# Patient Record
Sex: Male | Born: 1980 | Race: White | Hispanic: No | Marital: Married | Smoking: Current every day smoker
Health system: Southern US, Community
[De-identification: ages and names within clinical notes are randomized; demographics above are authoritative.]

## PROBLEM LIST (undated history)

## (undated) DIAGNOSIS — J45909 Unspecified asthma, uncomplicated: Secondary | ICD-10-CM

## (undated) DIAGNOSIS — R030 Elevated blood-pressure reading, without diagnosis of hypertension: Secondary | ICD-10-CM

## (undated) DIAGNOSIS — I10 Essential (primary) hypertension: Secondary | ICD-10-CM

## (undated) DIAGNOSIS — K429 Umbilical hernia without obstruction or gangrene: Secondary | ICD-10-CM

## (undated) DIAGNOSIS — E669 Obesity, unspecified: Secondary | ICD-10-CM

## (undated) DIAGNOSIS — C021 Malignant neoplasm of border of tongue: Secondary | ICD-10-CM

## (undated) DIAGNOSIS — F17200 Nicotine dependence, unspecified, uncomplicated: Secondary | ICD-10-CM

## (undated) HISTORY — DX: Malignant neoplasm of border of tongue: C02.1

## (undated) HISTORY — PX: TYMPANOSTOMY TUBE PLACEMENT: SHX32

---
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
## 2006-07-31 ENCOUNTER — Ambulatory Visit: Payer: Self-pay | Admitting: Emergency Medicine

## 2014-07-09 ENCOUNTER — Other Ambulatory Visit: Payer: Self-pay

## 2014-07-09 ENCOUNTER — Ambulatory Visit
Admit: 2014-07-09 | Disposition: A | Discharge status: Home / Self Care | Payer: Self-pay | Attending: Otolaryngology | Admitting: Otolaryngology

## 2014-07-09 HISTORY — PX: EXCISION OF TONGUE LESION: SHX6434

## 2014-07-21 ENCOUNTER — Inpatient Hospital Stay: Payer: PRIVATE HEALTH INSURANCE | Attending: Oncology | Admitting: Oncology

## 2014-07-21 ENCOUNTER — Encounter (INDEPENDENT_AMBULATORY_CARE_PROVIDER_SITE_OTHER): Payer: Self-pay

## 2014-07-21 ENCOUNTER — Encounter: Payer: Self-pay | Admitting: Oncology

## 2014-07-21 ENCOUNTER — Ambulatory Visit: Payer: PRIVATE HEALTH INSURANCE

## 2014-07-21 ENCOUNTER — Ambulatory Visit: Payer: PRIVATE HEALTH INSURANCE | Admitting: Oncology

## 2014-07-21 VITALS — BP 134/91 | HR 68 | Temp 94.9°F | Ht 74.0 in | Wt 260.1 lb

## 2014-07-21 DIAGNOSIS — G893 Neoplasm related pain (acute) (chronic): Secondary | ICD-10-CM | POA: Diagnosis not present

## 2014-07-21 DIAGNOSIS — C760 Malignant neoplasm of head, face and neck: Secondary | ICD-10-CM

## 2014-07-21 DIAGNOSIS — F17210 Nicotine dependence, cigarettes, uncomplicated: Secondary | ICD-10-CM | POA: Diagnosis not present

## 2014-07-21 DIAGNOSIS — C029 Malignant neoplasm of tongue, unspecified: Secondary | ICD-10-CM | POA: Insufficient documentation

## 2014-07-21 DIAGNOSIS — C023 Malignant neoplasm of anterior two-thirds of tongue, part unspecified: Secondary | ICD-10-CM | POA: Insufficient documentation

## 2014-07-21 DIAGNOSIS — F17220 Nicotine dependence, chewing tobacco, uncomplicated: Secondary | ICD-10-CM | POA: Diagnosis not present

## 2014-07-21 LAB — COMPREHENSIVE METABOLIC PANEL
ALT: 20 U/L (ref 17–63)
AST: 17 U/L (ref 15–41)
Albumin: 4.2 g/dL (ref 3.5–5.0)
Alkaline Phosphatase: 47 U/L (ref 38–126)
Anion gap: 5 (ref 5–15)
BILIRUBIN TOTAL: 0.6 mg/dL (ref 0.3–1.2)
BUN: 17 mg/dL (ref 6–20)
CHLORIDE: 107 mmol/L (ref 101–111)
CO2: 26 mmol/L (ref 22–32)
Calcium: 8.8 mg/dL — ABNORMAL LOW (ref 8.9–10.3)
Creatinine, Ser: 0.85 mg/dL (ref 0.61–1.24)
GFR calc Af Amer: >60 mL/min (ref >60)
Glucose, Bld: 103 mg/dL — ABNORMAL HIGH (ref 65–99)
Potassium: 3.7 mmol/L (ref 3.5–5.1)
SODIUM: 138 mmol/L (ref 135–145)
Total Protein: 7.4 g/dL (ref 6.5–8.1)

## 2014-07-21 LAB — CBC WITH DIFFERENTIAL/PLATELET
Basophils Absolute: 0 10*3/uL (ref 0–0.1)
Basophils Relative: 1 %
EOS ABS: 0.1 10*3/uL (ref 0–0.7)
Eosinophils Relative: 2 %
HCT: 45.2 % (ref 40.0–52.0)
Hemoglobin: 15.3 g/dL (ref 13.0–18.0)
LYMPHS ABS: 1.4 10*3/uL (ref 1.0–3.6)
LYMPHS PCT: 28 %
MCH: 31.4 pg (ref 26.0–34.0)
MCHC: 33.9 g/dL (ref 32.0–36.0)
MCV: 92.6 fL (ref 80.0–100.0)
Monocytes Absolute: 0.5 10*3/uL (ref 0.2–1.0)
Monocytes Relative: 10 %
Neutro Abs: 2.9 10*3/uL (ref 1.4–6.5)
Neutrophils Relative %: 59 %
PLATELETS: 235 10*3/uL (ref 150–440)
RBC: 4.88 MIL/uL (ref 4.40–5.90)
RDW: 13.3 % (ref 11.5–14.5)
WBC: 5 10*3/uL (ref 3.8–10.6)

## 2014-07-21 NOTE — Progress Notes (Signed)
[place] @ [facility] Telephone:(336) [phone]  Fax:(336) [place]: [birth year]  MR#: [id number]  QQV#:[id number]  Patient Care Team: No Pcp Per Patient as PCP - General (General Practice) [name], MD as Referring Physician (Otolaryngology)  CHIEF COMPLAINT:  Chief Complaint  Patient presents with  . Squamous Cell Carcinoma    Oncology History   1.  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  Moderately differentiated.  Lymphovascular invasion present.  Margins are clear.  Total size of the tumor was 1.2 cm.  Moderately differentiated.  Diagnosis on now May of 2016 status post excision     Carcinoma of tongue   07/21/2014 Initial Diagnosis Carcinoma of tongue    No flowsheet data found.  INTERVAL HISTORY: 34 year old gentleman is a chronic smoker as well as deep snuff.  Found painful ulcer on the left lateral anterior portion of the tongue patient was evaluated by ENT surgeon underwent excision.  Wound is healing well.  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  REVIEW OF SYSTEMS:   GENERAL:  Feels good.  Active.  No fevers, sweats or weight loss. PERFORMANCE STATUS (ECOG):  0 HEENT:  No visual changes, runny nose, sore throat, mouth sores or tenderness. Lungs: No shortness of breath or cough.  No hemoptysis. Cardiac:  No chest pain, palpitations, orthopnea, or PND. GI:  No nausea, vomiting, diarrhea, constipation, melena or hematochezia. GU:  No urgency, frequency, dysuria, or hematuria. Musculoskeletal:  No back pain.  No joint pain.  No muscle tenderness. Extremities:  No pain or swelling. Skin:  No rashes or skin changes. Neuro:  No headache, numbness or weakness, balance or coordination issues. Endocrine:  No diabetes, thyroid issues, hot flashes or night sweats. Psych:  No mood changes, depression or anxiety. Pain:  No focal pain. Review of systems:  All other systems reviewed  and found to be negative.  As per HPI. Otherwise, a complete review of systems is negatve.  PAST MEDICAL HISTORY: History reviewed. No pertinent past medical history.  PAST SURGICAL HISTORY: History reviewed. No pertinent past surgical history.  FAMILY HISTORY History reviewed. No pertinent family history.  GYNECOLOGIC HISTORY:  No LMP for male patient.     ADVANCED DIRECTIVES:    HEALTH MAINTENANCE: History  Substance Use Topics  . Smoking status: Current Every Day Smoker -- 1.00 packs/day    Types: Cigarettes  . Smokeless tobacco: Not on file  . Alcohol Use: 14.4 oz/week    24 Cans of beer per week     Colonoscopy:  PAP:  Bone density:  Lipid panel:  No Known Allergies  No current outpatient prescriptions on file.   No current facility-administered medications for this visit.    OBJECTIVE:  Filed Vitals:   07/21/14 0832  BP: 134/91  Pulse: 68  Temp: 94.9 F (34.9 C)     Body mass index is 33.39 kg/(m^2).    ECOG FS:0 - Asymptomatic  PHYSICAL EXAM:   GENERAL:  Well developed, well nourished, sitting comfortably in the exam room in no acute distress. MENTAL STATUS:  [place] and oriented to person, place and time. HEAD:    Normocephalic, atraumatic, face symmetric, no Cushingoid features. EYES:  Pupils equal round and reactive to light and accomodation.  No conjunctivitis or scleral icterus. ENT:  Left anterior tongue wound is healing well.  No other palpable lymph nodes RESPIRATORY:  Clear to auscultation without rales, wheezes or rhonchi.  CARDIOVASCULAR:  Regular rate and rhythm without murmur, rub or gallop.  ABDOMEN:  Soft, non-tender, with active bowel sounds, and no hepatosplenomegaly.  No masses. BACK:  No CVA tenderness.  No tenderness on percussion of the back or rib cage. SKIN:  No rashes, ulcers or lesions. EXTREMITIES: No edema, no skin discoloration or tenderness.  No palpable cords. LYMPH NODES: No palpable cervical, supraclavicular,  axillary or inguinal adenopathy  NEUROLOGICAL: Unremarkable. PSYCH:  Appropriate. LAB RESULTS:  No visits with results within 3 Day(s) from this visit. Latest known visit with results is:  Orders Only on 07/09/2014  Component Date Value Ref Range Status  . SURGICAL PATHOLOGY 07/09/2014    Final                   Value:Surgical Procedure CASE: [id number] PATIENT: Ross Chandler Surgical Pathology Report     SPECIMEN SUBMITTED: A. Left lateral tongue lesion, stitch marks Anterior  CLINICAL HISTORY: None provided  PRE-OPERATIVE DIAGNOSIS: Tongue lesion  POST-OPERATIVE DIAGNOSIS: Same     DIAGNOSIS: TONGUE, LEFT LATERAL LESION; EXCISION: - SQUAMOUS CELL CARCINOMA, INVASIVE, WELL TO MODERATELY DIFFERENTIATED, AND ULCERATED. - LYMPHOVASCULAR INVOLVEMENT IS PRESENT. - NO CARCINOMA SEEN IN MARGINS OF SPECIMEN. - SEE CANCER SUMMARY BELOW.   LIP AND ORAL CAVITY: Incisional Biopsy, Excisional Biopsy, Resection Lip and Oral Cavity, Excisional Biopsy, Resection Cancer Case Summary SPECIMEN Specimen: Lateral border of tongue Received: In formalin Procedure:     Excisional biopsy Specimen Size Greatest Dimension (cm): 2.6cm Additional Dimension (cm):    Additional Dimension (cm) 1.8cm Additional Dimension (cm) 0.6cm Specimen Laterality:     Left Primary Tumor Site: L                         ateral border of tongue Additional Sites Involved by Tumor:     None identified Tumor Focality:     Single focus TUMOR Histologic Type:    Squamous cell carcinoma, conventional Histologic Grade:   G2: Moderately differentiated EXTENT Tumor Size:    Greatest dimension (cm) 1.2cm MARGINS Margins - Invasive Status:    Margins uninvolved by invasive carcinoma Distance from Closest Margin: Specify distance (mm) 11mm Specify Location of Closest Margin, per Orientation, if possible: Specify Suture marks anterior which is designated 12:00.  The closest margin is 9:00 Margins -  In Situ Status#:    Not applicable ACCESSORY FINDINGS Lymph-Vascular Invasion: Present Perineural Invasion:     Not identified LYMPH NODES Lymph Nodes, Extranodal Extension: No nodes submitted or found STAGE (pTNM) Pathologic Staging: For All Carcinomas Excluding Mucosal Melanoma Primary Tumor (pT): pT1: Tumor 2 cm or less in greatest dimension Regional Lymph Nodes (pN) #:  pNX: Cannot be assesse                         d No nodes submitted or found Distant Metastasis (pM): Not applicable     GROSS DESCRIPTION: A. Labeled: Left lateral tongue lesion Tissue Fragment(s): 1 Measurement: 2.6 x 1.8 x 0.6 cm Comment: Roughly ovoid piece of pink red soft tissue covered by mucosa. A length of suture tied to the specimen to designate the anterior position. For dissection purposes the suture  is designated as 12:00. 12:00-6:00 inked blue, 6:00-12:00 inked black. Noted on the mucosa is a raised pale tan yellow nodular lesion measuring 1.2 x 1.2 cm in greatest dimensions, which grossly appears to be located 0.2 cm away from the radial 9:00 margin of resection.  Portions of the underlying tissue are hemorrhagic.  Entirely submitted in cassette(s): 1- en face 12:00 margin 2- en face 6:00 margin 3-5- rest the specimen taken from 12:00 towards 6:00 margin    Final Diagnosis performed by Hassan Buckler, MD.  Electronically signed 07/14/2014 11:28:18AM    The electronic signature indicates                          that the named Attending Pathologist has evaluated the specimen  Technical component performed at [facility], [address] [place], [place] [address] Lab: [phone] Dir: Darrick Penna. Evette Doffing, MD  Professional component performed at [facility], [facility], [place], [place], [place] [address] Lab: [phone] Dir: Dellia Nims. Rubinas, MD       STUDIES: CBC and comprehensive metabolic panel pending  MEDICAL DECISION MAKING:  pathology report has  been reviewed. Case has been discussed with Dr. Kathyrn Sheriff  ENT surgeon. Detailed discussion with patient and wife Patient has a lymphovascular invasion as poor Prognosic  Factor Case will be discussed in tumor conference after PET scan is done PET scan has been ordered Consideration off lymph node dissection limited  study be considered.  . Patient expressed understanding and was in agreement with this plan. He also understands that He can call clinic at any time with any questions, concerns, or complaints.    Carcinoma of tongue   Staging form: Lip and Oral Cavity, AJCC 7th Edition     Clinical: Stage I (T1, N0, M0) - Signed by Forest Gleason, MD on 07/21/2014       Prognostic indicators: A moderately differentiated    Forest Gleason, MD   07/21/2014 9:29 AM

## 2014-07-24 ENCOUNTER — Ambulatory Visit
Admission: RE | Admit: 2014-07-24 | Discharge: 2014-07-24 | Disposition: A | Discharge status: Home / Self Care | Payer: PRIVATE HEALTH INSURANCE | Source: Ambulatory Visit | Attending: Oncology | Admitting: Oncology

## 2014-07-24 DIAGNOSIS — C760 Malignant neoplasm of head, face and neck: Secondary | ICD-10-CM

## 2014-07-24 LAB — GLUCOSE, CAPILLARY: Glucose-Capillary: 79 mg/dL (ref 65–99)

## 2014-07-24 MED ORDER — FLUDEOXYGLUCOSE F - 18 (FDG) INJECTION
12.6700 | Freq: Once | INTRAVENOUS | Status: AC | PRN
  Administered 2014-07-24: 12.67 via INTRAVENOUS

## 2014-07-30 ENCOUNTER — Encounter: Payer: Self-pay | Admitting: Oncology

## 2014-07-30 ENCOUNTER — Inpatient Hospital Stay (HOSPITAL_BASED_OUTPATIENT_CLINIC_OR_DEPARTMENT_OTHER): Payer: PRIVATE HEALTH INSURANCE | Admitting: Oncology

## 2014-07-30 VITALS — BP 145/90 | HR 85 | Wt 261.0 lb

## 2014-07-30 DIAGNOSIS — C023 Malignant neoplasm of anterior two-thirds of tongue, part unspecified: Secondary | ICD-10-CM

## 2014-07-30 DIAGNOSIS — C029 Malignant neoplasm of tongue, unspecified: Secondary | ICD-10-CM

## 2014-07-30 DIAGNOSIS — F17210 Nicotine dependence, cigarettes, uncomplicated: Secondary | ICD-10-CM

## 2014-07-30 DIAGNOSIS — F17220 Nicotine dependence, chewing tobacco, uncomplicated: Secondary | ICD-10-CM

## 2014-07-30 DIAGNOSIS — G893 Neoplasm related pain (acute) (chronic): Secondary | ICD-10-CM | POA: Diagnosis not present

## 2014-07-31 ENCOUNTER — Encounter: Payer: Self-pay | Admitting: Oncology

## 2014-07-31 NOTE — Progress Notes (Signed)
[place] @ [facility] Telephone:(336) [phone]  Fax:(336) [place]: September 03, 1980  MR#: [id number]  QKM#:[id number]  Patient Care Team: No Pcp Per Patient as PCP - General (General Practice) [name], MD as Referring Physician (Otolaryngology)  CHIEF COMPLAINT:  Chief Complaint  Patient presents with  . Follow-up    Oncology History   1.  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  Moderately differentiated.  Lymphovascular invasion present.  Margins are clear.  Total size of the tumor was 1.2 cm.  Moderately differentiated.  Diagnosis on now May of 2016 status post excision     Carcinoma of tongue   07/21/2014 Initial Diagnosis Carcinoma of tongue    No flowsheet data found.  INTERVAL HISTORY: 34 year old gentleman is a chronic smoker as well as deep snuff.  Found painful ulcer on the left lateral anterior portion of the tongue patient was evaluated by ENT surgeon underwent excision.  Wound is healing well.  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  Case was discussed in tumor conference.   REVIEW OF SYSTEMS:   GENERAL:  Feels good.  Active.  No fevers, sweats or weight loss. PERFORMANCE STATUS (ECOG):  0 HEENT:  No visual changes, runny nose, sore throat, mouth sores or tenderness. Lungs: No shortness of breath or cough.  No hemoptysis. Cardiac:  No chest pain, palpitations, orthopnea, or PND. GI:  No nausea, vomiting, diarrhea, constipation, melena or hematochezia. GU:  No urgency, frequency, dysuria, or hematuria. Musculoskeletal:  No back pain.  No joint pain.  No muscle tenderness. Extremities:  No pain or swelling. Skin:  No rashes or skin changes. Neuro:  No headache, numbness or weakness, balance or coordination issues. Endocrine:  No diabetes, thyroid issues, hot flashes or night  sweats. Psych:  No mood changes, depression or anxiety. Pain:  No focal pain. Review of systems:  All other systems reviewed and found to be negative.  As per HPI. Otherwise, a complete review of systems is negatve.  PAST MEDICAL HISTORY: Past Medical History  Diagnosis Date  . Squamous cell carcinoma of lateral tongue left    PAST SURGICAL HISTORY: No past surgical history on file.  FAMILY HISTORY No significant family history of colon cancer breast cancer or ovarian cancer      ADVANCED DIRECTIVES Patient does not have any advance care directive   HEALTH MAINTENANCE: History  Substance Use Topics  . Smoking status: Current Every Day Smoker -- 1.00 packs/day    Types: Cigarettes  . Smokeless tobacco: Not on file  . Alcohol Use: 14.4 oz/week    24 Cans of beer per week      No Known Allergies  No current outpatient prescriptions on file.   No current facility-administered medications for this visit.    OBJECTIVE:  Filed Vitals:   07/30/14 1539  BP: 145/90  Pulse: 85     Body mass index is 33.5 kg/(m^2).    ECOG FS:0 - Asymptomatic  PHYSICAL EXAM:   GENERAL:  Well developed, well nourished, sitting comfortably in the exam room in no acute distress. MENTAL STATUS:  Alert and oriented to person, place and time. HEAD:    Normocephalic, atraumatic, face symmetric, no Cushingoid features. EYES:  Pupils equal round and reactive to light and accomodation.  No conjunctivitis or scleral icterus. ENT:  Left anterior tongue  wound is healing well.  No other palpable lymph nodes RESPIRATORY:  Clear to auscultation without rales, wheezes or rhonchi. CARDIOVASCULAR:  Regular rate and rhythm without murmur, rub or gallop.  ABDOMEN:  Soft, non-tender, with active bowel sounds, and no hepatosplenomegaly.  No masses. BACK:  No CVA tenderness.  No tenderness on percussion of the back or rib cage. SKIN:  No rashes, ulcers or lesions. EXTREMITIES: No edema, no skin  discoloration or tenderness.  No palpable cords. LYMPH NODES: No palpable cervical, supraclavicular, axillary or inguinal adenopathy  NEUROLOGICAL: Unremarkable. PSYCH:  Appropriate. LAB RESULTS:  No visits with results within 3 Day(s) from this visit. Latest known visit with results is:  Hospital Outpatient Visit on 07/24/2014  Component Date Value Ref Range Status  . Glucose-Capillary 07/24/2014 79  65 - 99 mg/dL Final   ASSESSMENT: Carcinoma of tongue stage I disease.   MEDICAL DECISION MAKING:    PET scan has been reviewed independently.  There is some increased uptake in now base of the tongue which could be physiological.  I will discuss situation with Dr. Gibson Ramp about evaluating that area.  There is a submandibular lymph node which very well could be reactive as reviewed in a PET scan independently and reviewed with the radiologist sows a fatty hilum in the lymph node suggest you of normal lymph node.  During discussion in tumor conference possibility of firm radiation therapy to the ipsilateral neck area to cover the lymph node versus lymph node dissection and been discussed.  Patient would be referred to Dr. Baruch Gouty and now was discussed situation with Dr. Kathyrn Sheriff Carcinoma of tongue   Staging form: Lip and Oral Cavity, AJCC 7th Edition     Clinical: Stage I (T1, N0, M0) - Signed by Forest Gleason, MD on 07/21/2014       Prognostic indicators: A moderately differentiated    Forest Gleason, MD   07/31/2014 10:16 AM

## 2014-08-04 LAB — SURGICAL PATHOLOGY

## 2014-08-07 ENCOUNTER — Encounter: Payer: Self-pay | Admitting: *Deleted

## 2014-08-07 ENCOUNTER — Institutional Professional Consult (permissible substitution): Payer: PRIVATE HEALTH INSURANCE | Admitting: Radiation Oncology

## 2014-08-11 NOTE — Discharge Instructions (Signed)

## 2014-08-13 ENCOUNTER — Encounter
Admission: RE | Disposition: A | Discharge status: Home / Self Care | Payer: Self-pay | Source: Ambulatory Visit | Attending: Otolaryngology

## 2014-08-13 ENCOUNTER — Ambulatory Visit: Payer: PRIVATE HEALTH INSURANCE | Admitting: Anesthesiology

## 2014-08-13 ENCOUNTER — Encounter: Payer: Self-pay | Admitting: Otolaryngology

## 2014-08-13 ENCOUNTER — Ambulatory Visit
Admission: RE | Admit: 2014-08-13 | Discharge: 2014-08-13 | Disposition: A | Discharge status: Home / Self Care | Payer: PRIVATE HEALTH INSURANCE | Source: Ambulatory Visit | Attending: Otolaryngology | Admitting: Otolaryngology

## 2014-08-13 DIAGNOSIS — F17210 Nicotine dependence, cigarettes, uncomplicated: Secondary | ICD-10-CM | POA: Insufficient documentation

## 2014-08-13 DIAGNOSIS — H9190 Unspecified hearing loss, unspecified ear: Secondary | ICD-10-CM | POA: Diagnosis not present

## 2014-08-13 DIAGNOSIS — Z85810 Personal history of malignant neoplasm of tongue: Secondary | ICD-10-CM | POA: Insufficient documentation

## 2014-08-13 DIAGNOSIS — I10 Essential (primary) hypertension: Secondary | ICD-10-CM | POA: Insufficient documentation

## 2014-08-13 DIAGNOSIS — J349 Unspecified disorder of nose and nasal sinuses: Secondary | ICD-10-CM | POA: Insufficient documentation

## 2014-08-13 DIAGNOSIS — J45909 Unspecified asthma, uncomplicated: Secondary | ICD-10-CM | POA: Insufficient documentation

## 2014-08-13 DIAGNOSIS — R590 Localized enlarged lymph nodes: Secondary | ICD-10-CM | POA: Insufficient documentation

## 2014-08-13 DIAGNOSIS — J311 Chronic nasopharyngitis: Secondary | ICD-10-CM | POA: Insufficient documentation

## 2014-08-13 DIAGNOSIS — Z79899 Other long term (current) drug therapy: Secondary | ICD-10-CM | POA: Insufficient documentation

## 2014-08-13 DIAGNOSIS — J351 Hypertrophy of tonsils: Secondary | ICD-10-CM | POA: Diagnosis not present

## 2014-08-13 HISTORY — DX: Essential (primary) hypertension: I10

## 2014-08-13 HISTORY — PX: EXCISION NASAL MASS: SHX6271

## 2014-08-13 HISTORY — PX: DIRECT LARYNGOSCOPY: SHX5326

## 2014-08-13 SURGERY — EXCISION, MASS, NOSE
Anesthesia: General | Wound class: Clean Contaminated

## 2014-08-13 MED ORDER — MIDAZOLAM HCL 5 MG/5ML IJ SOLN
INTRAMUSCULAR | Status: DC | PRN
Start: 2014-08-13 — End: 2014-08-13
  Administered 2014-08-13: 2 mg via INTRAVENOUS

## 2014-08-13 MED ORDER — SUCCINYLCHOLINE CHLORIDE 20 MG/ML IJ SOLN
INTRAMUSCULAR | Status: DC | PRN
  Administered 2014-08-13: 100 mg via INTRAVENOUS

## 2014-08-13 MED ORDER — FENTANYL CITRATE (PF) 100 MCG/2ML IJ SOLN: 25.0000 ug | INTRAMUSCULAR | Status: DC | PRN

## 2014-08-13 MED ORDER — ONDANSETRON HCL 4 MG/2ML IJ SOLN: 4.0000 mg | Freq: Once | INTRAMUSCULAR | Status: DC | PRN

## 2014-08-13 MED ORDER — LIDOCAINE HCL (CARDIAC) 20 MG/ML IV SOLN
INTRAVENOUS | Status: DC | PRN
  Administered 2014-08-13: 40 mg via INTRAVENOUS

## 2014-08-13 MED ORDER — PROPOFOL 10 MG/ML IV BOLUS
INTRAVENOUS | Status: DC | PRN
  Administered 2014-08-13: 200 mg via INTRAVENOUS

## 2014-08-13 MED ORDER — ALBUTEROL SULFATE HFA 108 (90 BASE) MCG/ACT IN AERS
INHALATION_SPRAY | RESPIRATORY_TRACT | Status: DC | PRN
  Administered 2014-08-13 (×2): 4 via RESPIRATORY_TRACT

## 2014-08-13 MED ORDER — FENTANYL CITRATE (PF) 100 MCG/2ML IJ SOLN
INTRAMUSCULAR | Status: DC | PRN
  Administered 2014-08-13 (×2): 50 ug via INTRAVENOUS
  Administered 2014-08-13: 100 ug via INTRAVENOUS

## 2014-08-13 MED ORDER — LACTATED RINGERS IV SOLN
INTRAVENOUS | Status: DC
  Administered 2014-08-13 (×2): via INTRAVENOUS

## 2014-08-13 MED ORDER — HYDROCODONE-ACETAMINOPHEN 7.5-325 MG PO TABS: 1.0000 | ORAL_TABLET | Freq: Once | ORAL | Status: DC | PRN

## 2014-08-13 MED ORDER — LIDOCAINE-EPINEPHRINE 1 %-1:100000 IJ SOLN
INTRAMUSCULAR | Status: DC | PRN
  Administered 2014-08-13: 1 mL

## 2014-08-13 MED ORDER — PHENYLEPHRINE HCL 0.5 % NA SOLN
NASAL | Status: DC | PRN
  Administered 2014-08-13: 30 mL via TOPICAL

## 2014-08-13 MED ORDER — ONDANSETRON HCL 4 MG/2ML IJ SOLN
INTRAMUSCULAR | Status: DC | PRN
  Administered 2014-08-13: 4 mg via INTRAVENOUS

## 2014-08-13 MED ORDER — LIDOCAINE HCL 4 % MT SOLN
OROMUCOSAL | Status: DC | PRN
  Administered 2014-08-13: 2 mL via TOPICAL

## 2014-08-13 MED ORDER — DEXAMETHASONE SODIUM PHOSPHATE 4 MG/ML IJ SOLN
INTRAMUSCULAR | Status: DC | PRN
  Administered 2014-08-13: 10 mg via INTRAVENOUS

## 2014-08-13 SURGICAL SUPPLY — 25 items
BASIN GRAD PLASTIC 32OZ STRL (MISCELLANEOUS) IMPLANT
BLOCK BITE GUARD (MISCELLANEOUS) ×3 IMPLANT
COAGULATOR SUCT 8FR VV (MISCELLANEOUS) ×3 IMPLANT
COVER MAYO STAND STRL (DRAPES) ×3 IMPLANT
COVER TABLE BACK 60X90 (DRAPES) ×3 IMPLANT
CUP MEDICINE 2OZ PLAST GRAD ST (MISCELLANEOUS) IMPLANT
DRAPE SHEET LG 3/4 BI-LAMINATE (DRAPES) ×3 IMPLANT
DRESSING TELFA 4X3 1S ST N-ADH (GAUZE/BANDAGES/DRESSINGS) ×3 IMPLANT
GLOVE PI ULTRA LF STRL 7.5 (GLOVE) ×1 IMPLANT
GLOVE PI ULTRA NON LATEX 7.5 (GLOVE) ×2
MARKER SKIN SURG W/RULER VIO (MISCELLANEOUS) IMPLANT
NEEDLE 18GX1X1/2 (RX/OR ONLY) (NEEDLE) IMPLANT
NEEDLE FILTER BLUNT 18X 1/2SAF (NEEDLE)
NEEDLE FILTER BLUNT 18X1 1/2 (NEEDLE) IMPLANT
NEEDLE SPNL 25GX3.5 QUINCKE BL (NEEDLE) ×3 IMPLANT
NS IRRIG 500ML POUR BTL (IV SOLUTION) IMPLANT
PATTIES SURGICAL .5 X.5 (GAUZE/BANDAGES/DRESSINGS) ×3 IMPLANT
PATTIES SURGICAL .5 X3 (DISPOSABLE) ×3 IMPLANT
SPONGE XRAY 4X4 16PLY STRL (MISCELLANEOUS) ×3 IMPLANT
STRAP BODY AND KNEE 60X3 (MISCELLANEOUS) ×3 IMPLANT
SYRINGE 10CC LL (SYRINGE) IMPLANT
TOWEL OR 17X26 4PK STRL BLUE (TOWEL DISPOSABLE) ×3 IMPLANT
TUBING CONN 6MMX3.1M (TUBING) ×2
TUBING SUCTION CONN 0.25 STRL (TUBING) ×1 IMPLANT
WATER STERILE IRR 250ML POUR (IV SOLUTION) ×3 IMPLANT

## 2014-08-13 NOTE — Transfer of Care (Signed)
Immediate Anesthesia Transfer of Care Note  Patient: Ross Chandler  Procedure(s) Performed: Procedure(s): EXCISION NASAL MASS (N/A) DIRECT LARYNGOSCOPY (N/A)  Patient Location: PACU  Anesthesia Type: General ETT  Level of Consciousness: awake, alert  and patient cooperative  Airway and Oxygen Therapy: Patient Spontanous Breathing and Patient connected to supplemental oxygen  Post-op Assessment: Post-op Vital signs reviewed, Patient's Cardiovascular Status Stable, Respiratory Function Stable, Patent Airway and No signs of Nausea or vomiting  Post-op Vital Signs: Reviewed and stable  Complications: No apparent anesthesia complications

## 2014-08-13 NOTE — Anesthesia Procedure Notes (Signed)
Procedure Name: Intubation Date/Time: 08/13/2014 10:34 AM Performed by: Londell Moh Pre-anesthesia Checklist: Emergency Drugs available Patient Re-evaluated:Patient Re-evaluated prior to inductionOxygen Delivery Method: Circle system utilized Preoxygenation: Pre-oxygenation with 100% oxygen Intubation Type: IV induction Ventilation: Mask ventilation without difficulty and Oral airway inserted - appropriate to patient size Laryngoscope Size: Mac and 3 Grade View: Grade I Tube type: Oral Rae Tube size: 7.5 mm Number of attempts: 1 Placement Confirmation: ETT inserted through vocal cords under direct vision,  positive ETCO2 and breath sounds checked- equal and bilateral Tube secured with: Tape Dental Injury: Teeth and Oropharynx as per pre-operative assessment

## 2014-08-13 NOTE — Op Note (Signed)
08/13/2014  11:18 AM    Rebeca Alert  [id number]   Pre-Op Dx:  1. Nasopharyngeal mass  2. Large lingual tonsils  Post-op Dx: 1. 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 Large lingual tonsils  Proc: 1. Endoscopic biopsy of nasopharyngeal mass     2. Direct laryngoscopy and biopsy of the lingual tonsils  Surg:  Nickolette Espinola H  Anes:  GOT  EBL:  50 mL  Comp:  None  Findings:  Nasopharyngeal mass had enlarged. It was on the posterior superior right adenoid bed. 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 The lingual tonsils were enlarged on both sides but did not appear to be firm or granular.  Procedure: The patient was brought to the operating room and placed in a supine position. Gen. anesthesia was given by oral endotracheal intubation. The nose was prepped with cottonoid pledgets soaked in phenylephrine and Xylocaine for vasoconstriction of the mucous membranes.  A Dedo laryngoscope was used for visualization of the hypopharynx and larynx. Piriform sinuses were clear. The larynx showed normal vocal cords. The epiglottis was normal size and shape. The tongue base showed evidence of lingual tonsils on both sides. Palpation of this did not appear to be real firm. Up-biting cup forceps were used for the key multiple pieces of lingual tonsils on both sides of the tongue base. These were sent for permanent section.  The cottonoid pledgets were removed from the nose and the 0 scope was used for re-visualizing this area. A picture was taken of the mass in the right nasopharynx. A freer elevator was used to move around and see the attachments were directly into the adenoid tissue. Some thick clear gel was expressed from the inferior border of it to suggest this was cystic and probably a Thornwald cyst. The Gruenwald forceps were used for marsupializing the cyst. The entire face of the mass was removed and sent for permanent section. Suction  cautery was used for cauterizing around the bed and sides of the cyst to control bleeding. No further masses were noted.   Patient was awakened and taken to the recovery room in satisfactory condition. There were no operative complications. Total estimated blood loss was 50 mL.  Dispo:   PACU to home. Rest at home.  Plan:  Follow-up in office in 6 days to go over path report. Increase diet as tolerated.  Chong January H  08/13/2014 11:18 AM

## 2014-08-13 NOTE — Anesthesia Preprocedure Evaluation (Signed)
Anesthesia Evaluation  Patient identified by MRN, date of birth, ID band  Reviewed: Allergy & Precautions, H&P , NPO status , Patient's Chart, lab work & pertinent test results  Airway Mallampati: II  TM Distance: >3 FB Neck ROM: full    Dental no notable dental hx.    Pulmonary asthma , Current Smoker,    Pulmonary exam normal       Cardiovascular hypertension, Rhythm:regular Rate:Normal     Neuro/Psych    GI/Hepatic   Endo/Other    Renal/GU      Musculoskeletal   Abdominal   Peds  Hematology   Anesthesia Other Findings   Reproductive/Obstetrics                             Anesthesia Physical Anesthesia Plan  ASA: II  Anesthesia Plan: General ETT   Post-op Pain Management:    Induction:   Airway Management Planned:   Additional Equipment:   Intra-op Plan:   Post-operative Plan:   Informed Consent: I have reviewed the patients History and Physical, chart, labs and discussed the procedure including the risks, benefits and alternatives for the proposed anesthesia with the patient or authorized representative who has indicated his/her understanding and acceptance.     Plan Discussed with: CRNA  Anesthesia Plan Comments:         Anesthesia Quick Evaluation

## 2014-08-13 NOTE — Anesthesia Postprocedure Evaluation (Signed)
  Anesthesia Post-op Note  Patient: Ross Chandler  Procedure(s) Performed: Procedure(s): EXCISION NASAL MASS (N/A) DIRECT LARYNGOSCOPY (N/A)  Anesthesia type:General ETT  Patient location: PACU  Post pain: Pain level controlled  Post assessment: Post-op Vital signs reviewed, Patient's Cardiovascular Status Stable, Respiratory Function Stable, Patent Airway and No signs of Nausea or vomiting  Post vital signs: Reviewed and stable  Last Vitals:  Filed Vitals:   08/13/14 1200  BP:   Pulse: 89  Temp: 36.6 C  Resp: 14    Level of consciousness: awake, alert  and patient cooperative  Complications: No apparent anesthesia complications

## 2014-08-13 NOTE — H&P (Signed)
  H&P has been reviewed and no changes necessary. To be downloaded later. 

## 2014-08-14 ENCOUNTER — Encounter: Payer: Self-pay | Admitting: Otolaryngology

## 2014-08-17 LAB — SURGICAL PATHOLOGY

## 2014-08-21 ENCOUNTER — Ambulatory Visit
Admission: RE | Admit: 2014-08-21 | Discharge: 2014-08-21 | Disposition: A | Discharge status: Home / Self Care | Payer: PRIVATE HEALTH INSURANCE | Source: Ambulatory Visit | Attending: Radiation Oncology | Admitting: Radiation Oncology

## 2014-08-21 ENCOUNTER — Encounter: Payer: Self-pay | Admitting: Radiation Oncology

## 2014-08-21 VITALS — BP 124/83 | HR 76 | Temp 96.8°F | Resp 18 | Wt 262.0 lb

## 2014-08-21 DIAGNOSIS — C029 Malignant neoplasm of tongue, unspecified: Secondary | ICD-10-CM

## 2014-08-21 DIAGNOSIS — Z510 Encounter for antineoplastic radiation therapy: Secondary | ICD-10-CM | POA: Insufficient documentation

## 2014-08-21 DIAGNOSIS — C023 Malignant neoplasm of anterior two-thirds of tongue, part unspecified: Secondary | ICD-10-CM | POA: Insufficient documentation

## 2014-08-21 NOTE — Progress Notes (Signed)
Patient and wife here today for a evaluation of raditaion therapy for tongue cancer.  He is 1 week post surgery for removal other benign masses from the back of the tongue.

## 2014-08-21 NOTE — Consult Note (Signed)
Radiation Oncology NEW PATIENT EVALUATION  Name: Ross Chandler  MRN: [id number]  Date:   08/21/2014     DOB: 1980/04/05   This 34 y.o. 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  REFERRING PHYSICIAN: Dr. Ladene Artist CHIEF COMPLAINT: No chief complaint on file.   DIAGNOSIS: The encounter diagnosis was Tongue malignant neoplasm.   PREVIOUS INVESTIGATIONS:  CT and PET/CT scans are reviewed, surgical pathology report reviewed,  clinical notes reviewed  HPI: Patient is a pleasant 34 year old male chronic smoker as well as using deep snuff was found to have a painful ulcer on the left lateral anterior portion of tongue seen by ENT. He underwent excision showing a 1.2 cm moderately differentiated squamous cell carcinoma with lymphovascular invasion present. Margins were clear. PET CT scan was performed showing hypermetabolic area around the tongue base as well as a left submandibular lymph node measuring 1.8 cm with an SUV of 4 suspicious based on location and activity for metastatic involvement. He was seen back by ENT underwent biopsy was made of nasopharyngeal base of tongue lingual tonsils all negative for metastatic disease. Patient is done well postoperatively. He is having no head and neck pain or dysphagia. His case was discussed at our tumor conference. Based on the lymphovascular invasion and possible left submandibular node adjuvant radiation therapy was recommended. He is seen today for opinion. He is doing well. He specifically denies dysphagia or head and neck pain.  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    PAST SURGICAL HISTORY:  Past Surgical History  Procedure Laterality Date  . Tympanostomy tube placement Bilateral 1982, 1986, 1987  . Tonsillectomy and adenoidectomy  1986  . Ear tube removal Right 1986  . Excision of tongue lesion Left 07/09/14    [facility]  . Excision nasal mass N/A 08/13/2014    Procedure: EXCISION NASAL MASS;  Surgeon: [name], MD;  Location: [facility];  Service: ENT;  Laterality: N/A;  . Direct laryngoscopy N/A 08/13/2014    Procedure: DIRECT LARYNGOSCOPY;  Surgeon: [name], MD;  Location: [facility];  Service: ENT;  Laterality: N/A;    FAMILY HISTORY: family history is not on file.  SOCIAL HISTORY:  reports that he has been smoking Cigarettes.  He has been smoking about 1.00 pack per day. He quit smokeless tobacco use about 5 months ago. He reports that he drinks about 14.4 oz of alcohol per week. He reports that he does not use illicit drugs.  ALLERGIES: Review of patient's allergies indicates no known allergies.  MEDICATIONS:  Current Outpatient Prescriptions  Medication Sig Dispense Refill  . albuterol (PROVENTIL HFA;VENTOLIN HFA) 108 (90 BASE) MCG/ACT inhaler Inhale 1 puff into the lungs 2 (two) times daily.    Marland Kitchen amoxicillin (AMOXIL) 875 MG tablet Take 875 mg by mouth 2 (two) times daily.    Marland Kitchen HYDROcodone-acetaminophen (NORCO/VICODIN) 5-325 MG per tablet Take 2 tablets by mouth every 4 (four) hours as needed for moderate pain.     No current facility-administered medications for this encounter.    ECOG PERFORMANCE STATUS:  0 - Asymptomatic  REVIEW OF SYSTEMS:  Patient denies any weight  loss, fatigue, weakness, fever, chills or night sweats. Patient denies any loss of vision, blurred vision. Patient denies any ringing  of the ears or hearing loss. No irregular heartbeat. Patient denies heart murmur or history of fainting. Patient denies any chest pain or pain radiating to her upper extremities. Patient denies  any shortness of breath, difficulty breathing at night, cough or hemoptysis. Patient denies any swelling in the lower legs. Patient denies any nausea vomiting, vomiting of blood, or coffee ground material in the vomitus. Patient denies any stomach pain. Patient states has had normal bowel movements no significant constipation or diarrhea. Patient denies any dysuria, hematuria or significant nocturia. Patient denies any problems walking, swelling in the joints or loss of balance. Patient denies any skin changes, loss of hair or loss of weight. Patient denies any excessive worrying or anxiety or significant depression. Patient denies any problems with insomnia. Patient denies excessive thirst, polyuria, polydipsia. Patient denies any swollen glands, patient denies easy bruising or easy bleeding. Patient denies any recent infections, allergies or URI. Patient "s visual fields have not changed significantly in recent time.    PHYSICAL EXAM: BP 124/83 mmHg  Pulse 76  Temp(Src) 96.8 F (36 C)  Resp 18  Wt 262 lb 0.3 oz (118.85 kg) Well-developed slightly obese male in NAD. Oral cavity is clear. He status post excision of the left lateral tongue which is well-healed no evidence of mass or nodularity in the oral cavity is noted. Teeth are in good state of repair. Indirect mirror examination shows upper airway clear vallecula and base of tongue within normal limits. Neck is clear without evidence of subject gastric cervical or supraclavicular adenopathy. Well-developed well-nourished patient in NAD. HEENT reveals PERLA, EOMI, discs not visualized.  Oral cavity is clear. No oral mucosal lesions are identified. Neck is clear without evidence of cervical or supraclavicular adenopathy. Lungs are clear to A&P. Cardiac examination is essentially unremarkable with regular rate and rhythm without murmur rub or thrill. Abdomen is benign with no organomegaly or masses noted. Motor sensory and DTR levels are equal and  symmetric in the upper and lower extremities. Cranial nerves II through XII are grossly intact. Proprioception is intact. No peripheral adenopathy or edema is identified. No motor or sensory levels are noted. Crude visual fields are within normal range.   LABORATORY DATA:  Surgical pathology report reviewed  RADIOLOGY RESULTS: PET CT scan reviewed IMPRESSION: Stage III clinical staging squamous cell carcinoma left lateral tongue in 34 year old male  PLAN: At this time I discussed the case personally with medical oncology. Based on the questionable left hypermetabolic some mandibular node as well as lymphovascular invasion believe adjuvant radiation therapy would be recommended. I will plan on delivering 7000 cGy to his left lateral tongue an area of hypermetabolic lymph node as well as 5400 cGy to his left neck using I am RT treatment planning and delivery with dose painting technique. I can spare his right neck and right submandibular and parotid glands as I do not feel they are significantly at risk for involvement. Risks and benefits of treatment including some minor xerostomia, dysphasia, skin reaction, oral mucositis all were discussed in detail with the patient. He seems to comprehend my treatment plan well. I have set up and ordered CT simulation on him for next week.  I would like to take this opportunity for allowing me to participate in the care of your patient.Armstead Peaks., MD

## 2014-08-26 ENCOUNTER — Ambulatory Visit
Admission: RE | Admit: 2014-08-26 | Discharge: 2014-08-26 | Disposition: A | Discharge status: Home / Self Care | Payer: PRIVATE HEALTH INSURANCE | Source: Ambulatory Visit | Attending: Radiation Oncology | Admitting: Radiation Oncology

## 2014-08-26 DIAGNOSIS — C023 Malignant neoplasm of anterior two-thirds of tongue, part unspecified: Secondary | ICD-10-CM | POA: Diagnosis not present

## 2014-08-26 DIAGNOSIS — Z510 Encounter for antineoplastic radiation therapy: Secondary | ICD-10-CM | POA: Diagnosis not present

## 2014-09-03 DIAGNOSIS — Z510 Encounter for antineoplastic radiation therapy: Secondary | ICD-10-CM | POA: Diagnosis not present

## 2014-09-04 ENCOUNTER — Other Ambulatory Visit: Payer: Self-pay | Admitting: *Deleted

## 2014-09-04 DIAGNOSIS — C029 Malignant neoplasm of tongue, unspecified: Secondary | ICD-10-CM

## 2014-09-07 ENCOUNTER — Ambulatory Visit
Admission: RE | Admit: 2014-09-07 | Discharge: 2014-09-07 | Disposition: A | Discharge status: Home / Self Care | Payer: PRIVATE HEALTH INSURANCE | Source: Ambulatory Visit | Attending: Radiation Oncology | Admitting: Radiation Oncology

## 2014-09-07 DIAGNOSIS — Z510 Encounter for antineoplastic radiation therapy: Secondary | ICD-10-CM | POA: Diagnosis not present

## 2014-09-08 ENCOUNTER — Ambulatory Visit
Admission: RE | Admit: 2014-09-08 | Discharge: 2014-09-08 | Disposition: A | Discharge status: Home / Self Care | Payer: PRIVATE HEALTH INSURANCE | Source: Ambulatory Visit | Attending: Radiation Oncology | Admitting: Radiation Oncology

## 2014-09-08 DIAGNOSIS — Z510 Encounter for antineoplastic radiation therapy: Secondary | ICD-10-CM | POA: Diagnosis not present

## 2014-09-09 ENCOUNTER — Ambulatory Visit
Admission: RE | Admit: 2014-09-09 | Discharge: 2014-09-09 | Disposition: A | Discharge status: Home / Self Care | Payer: PRIVATE HEALTH INSURANCE | Source: Ambulatory Visit | Attending: Radiation Oncology | Admitting: Radiation Oncology

## 2014-09-09 DIAGNOSIS — Z510 Encounter for antineoplastic radiation therapy: Secondary | ICD-10-CM | POA: Diagnosis not present

## 2014-09-10 ENCOUNTER — Ambulatory Visit
Admission: RE | Admit: 2014-09-10 | Discharge: 2014-09-10 | Disposition: A | Discharge status: Home / Self Care | Payer: PRIVATE HEALTH INSURANCE | Source: Ambulatory Visit | Attending: Radiation Oncology | Admitting: Radiation Oncology

## 2014-09-10 DIAGNOSIS — Z510 Encounter for antineoplastic radiation therapy: Secondary | ICD-10-CM | POA: Diagnosis not present

## 2014-09-11 ENCOUNTER — Ambulatory Visit
Admission: RE | Admit: 2014-09-11 | Discharge: 2014-09-11 | Disposition: A | Discharge status: Home / Self Care | Payer: PRIVATE HEALTH INSURANCE | Source: Ambulatory Visit | Attending: Radiation Oncology | Admitting: Radiation Oncology

## 2014-09-11 DIAGNOSIS — Z510 Encounter for antineoplastic radiation therapy: Secondary | ICD-10-CM | POA: Diagnosis not present

## 2014-09-15 ENCOUNTER — Ambulatory Visit: Payer: PRIVATE HEALTH INSURANCE

## 2014-09-15 ENCOUNTER — Encounter: Payer: Self-pay | Admitting: Radiation Oncology

## 2014-09-16 ENCOUNTER — Ambulatory Visit
Admission: RE | Admit: 2014-09-16 | Discharge: 2014-09-16 | Disposition: A | Discharge status: Home / Self Care | Payer: PRIVATE HEALTH INSURANCE | Source: Ambulatory Visit | Attending: Radiation Oncology | Admitting: Radiation Oncology

## 2014-09-16 DIAGNOSIS — Z510 Encounter for antineoplastic radiation therapy: Secondary | ICD-10-CM | POA: Diagnosis not present

## 2014-09-17 ENCOUNTER — Ambulatory Visit
Admission: RE | Admit: 2014-09-17 | Discharge: 2014-09-17 | Disposition: A | Discharge status: Home / Self Care | Payer: PRIVATE HEALTH INSURANCE | Source: Ambulatory Visit | Attending: Radiation Oncology | Admitting: Radiation Oncology

## 2014-09-17 DIAGNOSIS — Z510 Encounter for antineoplastic radiation therapy: Secondary | ICD-10-CM | POA: Diagnosis not present

## 2014-09-18 ENCOUNTER — Ambulatory Visit
Admission: RE | Admit: 2014-09-18 | Discharge: 2014-09-18 | Disposition: A | Discharge status: Home / Self Care | Payer: PRIVATE HEALTH INSURANCE | Source: Ambulatory Visit | Attending: Radiation Oncology | Admitting: Radiation Oncology

## 2014-09-18 DIAGNOSIS — Z510 Encounter for antineoplastic radiation therapy: Secondary | ICD-10-CM | POA: Diagnosis not present

## 2014-09-21 ENCOUNTER — Inpatient Hospital Stay: Payer: PRIVATE HEALTH INSURANCE | Attending: Radiation Oncology

## 2014-09-21 ENCOUNTER — Ambulatory Visit
Admission: RE | Admit: 2014-09-21 | Discharge: 2014-09-21 | Disposition: A | Discharge status: Home / Self Care | Payer: PRIVATE HEALTH INSURANCE | Source: Ambulatory Visit | Attending: Radiation Oncology | Admitting: Radiation Oncology

## 2014-09-21 DIAGNOSIS — C029 Malignant neoplasm of tongue, unspecified: Secondary | ICD-10-CM | POA: Insufficient documentation

## 2014-09-21 DIAGNOSIS — Z510 Encounter for antineoplastic radiation therapy: Secondary | ICD-10-CM | POA: Diagnosis not present

## 2014-09-21 LAB — CBC
HEMATOCRIT: 46.9 % (ref 40.0–52.0)
Hemoglobin: 15.8 g/dL (ref 13.0–18.0)
MCH: 31.3 pg (ref 26.0–34.0)
MCHC: 33.6 g/dL (ref 32.0–36.0)
MCV: 93.3 fL (ref 80.0–100.0)
Platelets: 233 10*3/uL (ref 150–440)
RBC: 5.03 MIL/uL (ref 4.40–5.90)
RDW: 13.5 % (ref 11.5–14.5)
WBC: 5 10*3/uL (ref 3.8–10.6)

## 2014-09-22 ENCOUNTER — Ambulatory Visit
Admission: RE | Admit: 2014-09-22 | Discharge: 2014-09-22 | Disposition: A | Discharge status: Home / Self Care | Payer: PRIVATE HEALTH INSURANCE | Source: Ambulatory Visit | Attending: Radiation Oncology | Admitting: Radiation Oncology

## 2014-09-22 DIAGNOSIS — Z510 Encounter for antineoplastic radiation therapy: Secondary | ICD-10-CM | POA: Diagnosis not present

## 2014-09-23 ENCOUNTER — Ambulatory Visit
Admission: RE | Admit: 2014-09-23 | Discharge: 2014-09-23 | Disposition: A | Discharge status: Home / Self Care | Payer: PRIVATE HEALTH INSURANCE | Source: Ambulatory Visit | Attending: Radiation Oncology | Admitting: Radiation Oncology

## 2014-09-23 DIAGNOSIS — Z510 Encounter for antineoplastic radiation therapy: Secondary | ICD-10-CM | POA: Diagnosis not present

## 2014-09-24 ENCOUNTER — Ambulatory Visit
Admission: RE | Admit: 2014-09-24 | Discharge: 2014-09-24 | Disposition: A | Discharge status: Home / Self Care | Payer: PRIVATE HEALTH INSURANCE | Source: Ambulatory Visit | Attending: Radiation Oncology | Admitting: Radiation Oncology

## 2014-09-24 DIAGNOSIS — Z510 Encounter for antineoplastic radiation therapy: Secondary | ICD-10-CM | POA: Diagnosis not present

## 2014-09-25 ENCOUNTER — Ambulatory Visit
Admission: RE | Admit: 2014-09-25 | Discharge: 2014-09-25 | Disposition: A | Discharge status: Home / Self Care | Payer: PRIVATE HEALTH INSURANCE | Source: Ambulatory Visit | Attending: Radiation Oncology | Admitting: Radiation Oncology

## 2014-09-25 DIAGNOSIS — Z510 Encounter for antineoplastic radiation therapy: Secondary | ICD-10-CM | POA: Diagnosis not present

## 2014-09-28 ENCOUNTER — Ambulatory Visit: Payer: PRIVATE HEALTH INSURANCE

## 2014-09-28 ENCOUNTER — Inpatient Hospital Stay: Payer: PRIVATE HEALTH INSURANCE

## 2014-09-29 ENCOUNTER — Inpatient Hospital Stay: Payer: PRIVATE HEALTH INSURANCE

## 2014-09-29 ENCOUNTER — Other Ambulatory Visit: Payer: Self-pay | Admitting: *Deleted

## 2014-09-29 ENCOUNTER — Ambulatory Visit
Admission: RE | Admit: 2014-09-29 | Discharge: 2014-09-29 | Disposition: A | Discharge status: Home / Self Care | Payer: PRIVATE HEALTH INSURANCE | Source: Ambulatory Visit | Attending: Radiation Oncology | Admitting: Radiation Oncology

## 2014-09-29 DIAGNOSIS — Z510 Encounter for antineoplastic radiation therapy: Secondary | ICD-10-CM | POA: Diagnosis not present

## 2014-09-29 DIAGNOSIS — C029 Malignant neoplasm of tongue, unspecified: Secondary | ICD-10-CM

## 2014-09-29 LAB — COMPREHENSIVE METABOLIC PANEL
ALK PHOS: 58 U/L (ref 38–126)
ALT: 20 U/L (ref 17–63)
ANION GAP: 8 (ref 5–15)
AST: 19 U/L (ref 15–41)
Albumin: 4.4 g/dL (ref 3.5–5.0)
BUN: 9 mg/dL (ref 6–20)
CALCIUM: 8.7 mg/dL — AB (ref 8.9–10.3)
CHLORIDE: 103 mmol/L (ref 101–111)
CO2: 25 mmol/L (ref 22–32)
Creatinine, Ser: 0.95 mg/dL (ref 0.61–1.24)
GFR calc non Af Amer: >60 mL/min (ref >60)
Glucose, Bld: 107 mg/dL — ABNORMAL HIGH (ref 65–99)
Potassium: 3.6 mmol/L (ref 3.5–5.1)
Sodium: 136 mmol/L (ref 135–145)
TOTAL PROTEIN: 7.6 g/dL (ref 6.5–8.1)
Total Bilirubin: 1.1 mg/dL (ref 0.3–1.2)

## 2014-09-29 LAB — CBC
HCT: 46.3 % (ref 40.0–52.0)
Hemoglobin: 15.8 g/dL (ref 13.0–18.0)
MCH: 31.4 pg (ref 26.0–34.0)
MCHC: 34.1 g/dL (ref 32.0–36.0)
MCV: 92.1 fL (ref 80.0–100.0)
Platelets: 242 10*3/uL (ref 150–440)
RBC: 5.02 MIL/uL (ref 4.40–5.90)
RDW: 13.2 % (ref 11.5–14.5)
WBC: 4.4 10*3/uL (ref 3.8–10.6)

## 2014-09-29 MED ORDER — SUCRALFATE 1 G PO TABS: 1.0000 g | ORAL_TABLET | Freq: Three times a day (TID) | ORAL | Status: DC

## 2014-09-30 ENCOUNTER — Ambulatory Visit: Payer: PRIVATE HEALTH INSURANCE

## 2014-10-01 ENCOUNTER — Ambulatory Visit
Admission: RE | Admit: 2014-10-01 | Discharge: 2014-10-01 | Disposition: A | Discharge status: Home / Self Care | Payer: PRIVATE HEALTH INSURANCE | Source: Ambulatory Visit | Attending: Radiation Oncology | Admitting: Radiation Oncology

## 2014-10-01 DIAGNOSIS — Z510 Encounter for antineoplastic radiation therapy: Secondary | ICD-10-CM | POA: Diagnosis not present

## 2014-10-02 ENCOUNTER — Ambulatory Visit
Admission: RE | Admit: 2014-10-02 | Discharge: 2014-10-02 | Disposition: A | Discharge status: Home / Self Care | Payer: PRIVATE HEALTH INSURANCE | Source: Ambulatory Visit | Attending: Radiation Oncology | Admitting: Radiation Oncology

## 2014-10-02 DIAGNOSIS — Z510 Encounter for antineoplastic radiation therapy: Secondary | ICD-10-CM | POA: Diagnosis not present

## 2014-10-05 ENCOUNTER — Ambulatory Visit
Admission: RE | Admit: 2014-10-05 | Discharge: 2014-10-05 | Disposition: A | Discharge status: Home / Self Care | Payer: PRIVATE HEALTH INSURANCE | Source: Ambulatory Visit | Attending: Radiation Oncology | Admitting: Radiation Oncology

## 2014-10-05 ENCOUNTER — Inpatient Hospital Stay: Payer: PRIVATE HEALTH INSURANCE

## 2014-10-05 DIAGNOSIS — C029 Malignant neoplasm of tongue, unspecified: Secondary | ICD-10-CM | POA: Diagnosis not present

## 2014-10-05 DIAGNOSIS — Z510 Encounter for antineoplastic radiation therapy: Secondary | ICD-10-CM | POA: Diagnosis not present

## 2014-10-05 LAB — CBC WITH DIFFERENTIAL/PLATELET
BASOS ABS: 0 10*3/uL (ref 0–0.1)
Basophils Relative: 1 %
Eosinophils Absolute: 0.1 10*3/uL (ref 0–0.7)
Eosinophils Relative: 3 %
HCT: 45.5 % (ref 40.0–52.0)
HEMOGLOBIN: 15.4 g/dL (ref 13.0–18.0)
Lymphocytes Relative: 16 %
Lymphs Abs: 0.6 10*3/uL — ABNORMAL LOW (ref 1.0–3.6)
MCH: 31.2 pg (ref 26.0–34.0)
MCHC: 33.8 g/dL (ref 32.0–36.0)
MCV: 92.2 fL (ref 80.0–100.0)
Monocytes Absolute: 0.3 10*3/uL (ref 0.2–1.0)
Monocytes Relative: 9 %
NEUTROS ABS: 2.5 10*3/uL (ref 1.4–6.5)
Neutrophils Relative %: 71 %
PLATELETS: 228 10*3/uL (ref 150–440)
RBC: 4.94 MIL/uL (ref 4.40–5.90)
RDW: 13.2 % (ref 11.5–14.5)
WBC: 3.5 10*3/uL — ABNORMAL LOW (ref 3.8–10.6)

## 2014-10-06 ENCOUNTER — Ambulatory Visit
Admission: RE | Admit: 2014-10-06 | Discharge: 2014-10-06 | Disposition: A | Discharge status: Home / Self Care | Payer: PRIVATE HEALTH INSURANCE | Source: Ambulatory Visit | Attending: Radiation Oncology | Admitting: Radiation Oncology

## 2014-10-06 DIAGNOSIS — Z510 Encounter for antineoplastic radiation therapy: Secondary | ICD-10-CM | POA: Diagnosis not present

## 2014-10-07 ENCOUNTER — Ambulatory Visit
Admission: RE | Admit: 2014-10-07 | Discharge: 2014-10-07 | Disposition: A | Discharge status: Home / Self Care | Payer: PRIVATE HEALTH INSURANCE | Source: Ambulatory Visit | Attending: Radiation Oncology | Admitting: Radiation Oncology

## 2014-10-07 DIAGNOSIS — Z510 Encounter for antineoplastic radiation therapy: Secondary | ICD-10-CM | POA: Diagnosis not present

## 2014-10-08 ENCOUNTER — Ambulatory Visit
Admission: RE | Admit: 2014-10-08 | Discharge: 2014-10-08 | Disposition: A | Discharge status: Home / Self Care | Payer: PRIVATE HEALTH INSURANCE | Source: Ambulatory Visit | Attending: Radiation Oncology | Admitting: Radiation Oncology

## 2014-10-08 DIAGNOSIS — Z510 Encounter for antineoplastic radiation therapy: Secondary | ICD-10-CM | POA: Diagnosis not present

## 2014-10-09 ENCOUNTER — Ambulatory Visit
Admission: RE | Admit: 2014-10-09 | Discharge: 2014-10-09 | Disposition: A | Discharge status: Home / Self Care | Payer: PRIVATE HEALTH INSURANCE | Source: Ambulatory Visit | Attending: Radiation Oncology | Admitting: Radiation Oncology

## 2014-10-09 DIAGNOSIS — Z510 Encounter for antineoplastic radiation therapy: Secondary | ICD-10-CM | POA: Diagnosis not present

## 2014-10-12 ENCOUNTER — Ambulatory Visit: Payer: PRIVATE HEALTH INSURANCE

## 2014-10-12 ENCOUNTER — Inpatient Hospital Stay: Payer: PRIVATE HEALTH INSURANCE | Attending: Radiation Oncology

## 2014-10-12 DIAGNOSIS — C023 Malignant neoplasm of anterior two-thirds of tongue, part unspecified: Secondary | ICD-10-CM | POA: Insufficient documentation

## 2014-10-12 DIAGNOSIS — F17220 Nicotine dependence, chewing tobacco, uncomplicated: Secondary | ICD-10-CM | POA: Insufficient documentation

## 2014-10-12 DIAGNOSIS — F17210 Nicotine dependence, cigarettes, uncomplicated: Secondary | ICD-10-CM | POA: Insufficient documentation

## 2014-10-13 ENCOUNTER — Ambulatory Visit
Admission: RE | Admit: 2014-10-13 | Discharge: 2014-10-13 | Disposition: A | Discharge status: Home / Self Care | Payer: PRIVATE HEALTH INSURANCE | Source: Ambulatory Visit | Attending: Radiation Oncology | Admitting: Radiation Oncology

## 2014-10-13 ENCOUNTER — Inpatient Hospital Stay: Payer: PRIVATE HEALTH INSURANCE

## 2014-10-13 DIAGNOSIS — C023 Malignant neoplasm of anterior two-thirds of tongue, part unspecified: Secondary | ICD-10-CM | POA: Diagnosis not present

## 2014-10-13 DIAGNOSIS — F17210 Nicotine dependence, cigarettes, uncomplicated: Secondary | ICD-10-CM | POA: Diagnosis not present

## 2014-10-13 DIAGNOSIS — C029 Malignant neoplasm of tongue, unspecified: Secondary | ICD-10-CM | POA: Diagnosis present

## 2014-10-13 DIAGNOSIS — Z510 Encounter for antineoplastic radiation therapy: Secondary | ICD-10-CM | POA: Diagnosis not present

## 2014-10-13 DIAGNOSIS — F17220 Nicotine dependence, chewing tobacco, uncomplicated: Secondary | ICD-10-CM | POA: Diagnosis not present

## 2014-10-13 LAB — CBC
HCT: 47 % (ref 40.0–52.0)
HEMOGLOBIN: 16.2 g/dL (ref 13.0–18.0)
MCH: 31.8 pg (ref 26.0–34.0)
MCHC: 34.5 g/dL (ref 32.0–36.0)
MCV: 92 fL (ref 80.0–100.0)
Platelets: 237 10*3/uL (ref 150–440)
RBC: 5.1 MIL/uL (ref 4.40–5.90)
RDW: 13.3 % (ref 11.5–14.5)
WBC: 4.2 10*3/uL (ref 3.8–10.6)

## 2014-10-14 ENCOUNTER — Ambulatory Visit
Admission: RE | Admit: 2014-10-14 | Discharge: 2014-10-14 | Disposition: A | Discharge status: Home / Self Care | Payer: PRIVATE HEALTH INSURANCE | Source: Ambulatory Visit | Attending: Radiation Oncology | Admitting: Radiation Oncology

## 2014-10-14 DIAGNOSIS — Z510 Encounter for antineoplastic radiation therapy: Secondary | ICD-10-CM | POA: Diagnosis not present

## 2014-10-15 ENCOUNTER — Ambulatory Visit
Admission: RE | Admit: 2014-10-15 | Discharge: 2014-10-15 | Disposition: A | Discharge status: Home / Self Care | Payer: PRIVATE HEALTH INSURANCE | Source: Ambulatory Visit | Attending: Radiation Oncology | Admitting: Radiation Oncology

## 2014-10-15 DIAGNOSIS — Z510 Encounter for antineoplastic radiation therapy: Secondary | ICD-10-CM | POA: Diagnosis not present

## 2014-10-16 ENCOUNTER — Ambulatory Visit
Admission: RE | Admit: 2014-10-16 | Discharge: 2014-10-16 | Disposition: A | Discharge status: Home / Self Care | Payer: PRIVATE HEALTH INSURANCE | Source: Ambulatory Visit | Attending: Radiation Oncology | Admitting: Radiation Oncology

## 2014-10-16 DIAGNOSIS — Z510 Encounter for antineoplastic radiation therapy: Secondary | ICD-10-CM | POA: Diagnosis not present

## 2014-10-19 ENCOUNTER — Ambulatory Visit
Admission: RE | Admit: 2014-10-19 | Discharge: 2014-10-19 | Disposition: A | Discharge status: Home / Self Care | Payer: PRIVATE HEALTH INSURANCE | Source: Ambulatory Visit | Attending: Radiation Oncology | Admitting: Radiation Oncology

## 2014-10-19 ENCOUNTER — Inpatient Hospital Stay: Payer: PRIVATE HEALTH INSURANCE

## 2014-10-19 DIAGNOSIS — C023 Malignant neoplasm of anterior two-thirds of tongue, part unspecified: Secondary | ICD-10-CM | POA: Diagnosis not present

## 2014-10-19 DIAGNOSIS — Z510 Encounter for antineoplastic radiation therapy: Secondary | ICD-10-CM | POA: Diagnosis not present

## 2014-10-19 DIAGNOSIS — C029 Malignant neoplasm of tongue, unspecified: Secondary | ICD-10-CM

## 2014-10-19 LAB — CBC
HCT: 47.4 % (ref 40.0–52.0)
Hemoglobin: 16.3 g/dL (ref 13.0–18.0)
MCH: 31.7 pg (ref 26.0–34.0)
MCHC: 34.4 g/dL (ref 32.0–36.0)
MCV: 92.3 fL (ref 80.0–100.0)
Platelets: 244 10*3/uL (ref 150–440)
RBC: 5.13 MIL/uL (ref 4.40–5.90)
RDW: 13.1 % (ref 11.5–14.5)
WBC: 4.7 10*3/uL (ref 3.8–10.6)

## 2014-10-20 ENCOUNTER — Ambulatory Visit
Admission: RE | Admit: 2014-10-20 | Discharge: 2014-10-20 | Disposition: A | Discharge status: Home / Self Care | Payer: PRIVATE HEALTH INSURANCE | Source: Ambulatory Visit | Attending: Radiation Oncology | Admitting: Radiation Oncology

## 2014-10-20 DIAGNOSIS — Z510 Encounter for antineoplastic radiation therapy: Secondary | ICD-10-CM | POA: Diagnosis not present

## 2014-10-21 ENCOUNTER — Ambulatory Visit: Payer: PRIVATE HEALTH INSURANCE

## 2014-10-21 ENCOUNTER — Ambulatory Visit
Admission: RE | Admit: 2014-10-21 | Discharge: 2014-10-21 | Disposition: A | Discharge status: Home / Self Care | Payer: PRIVATE HEALTH INSURANCE | Source: Ambulatory Visit | Attending: Radiation Oncology | Admitting: Radiation Oncology

## 2014-10-21 DIAGNOSIS — Z510 Encounter for antineoplastic radiation therapy: Secondary | ICD-10-CM | POA: Diagnosis not present

## 2014-10-22 ENCOUNTER — Ambulatory Visit: Payer: PRIVATE HEALTH INSURANCE

## 2014-10-22 ENCOUNTER — Ambulatory Visit
Admission: RE | Admit: 2014-10-22 | Discharge: 2014-10-22 | Disposition: A | Discharge status: Home / Self Care | Payer: PRIVATE HEALTH INSURANCE | Source: Ambulatory Visit | Attending: Radiation Oncology | Admitting: Radiation Oncology

## 2014-10-22 DIAGNOSIS — Z510 Encounter for antineoplastic radiation therapy: Secondary | ICD-10-CM | POA: Diagnosis not present

## 2014-10-23 ENCOUNTER — Ambulatory Visit: Payer: PRIVATE HEALTH INSURANCE

## 2014-10-23 ENCOUNTER — Ambulatory Visit
Admission: RE | Admit: 2014-10-23 | Discharge: 2014-10-23 | Disposition: A | Discharge status: Home / Self Care | Payer: PRIVATE HEALTH INSURANCE | Source: Ambulatory Visit | Attending: Radiation Oncology | Admitting: Radiation Oncology

## 2014-10-23 DIAGNOSIS — Z510 Encounter for antineoplastic radiation therapy: Secondary | ICD-10-CM | POA: Diagnosis not present

## 2014-10-26 ENCOUNTER — Ambulatory Visit: Payer: PRIVATE HEALTH INSURANCE

## 2014-10-26 ENCOUNTER — Ambulatory Visit
Admission: RE | Admit: 2014-10-26 | Discharge: 2014-10-26 | Disposition: A | Discharge status: Home / Self Care | Payer: PRIVATE HEALTH INSURANCE | Source: Ambulatory Visit | Attending: Radiation Oncology | Admitting: Radiation Oncology

## 2014-10-26 DIAGNOSIS — Z510 Encounter for antineoplastic radiation therapy: Secondary | ICD-10-CM | POA: Diagnosis not present

## 2014-10-27 ENCOUNTER — Ambulatory Visit: Payer: PRIVATE HEALTH INSURANCE

## 2014-10-27 ENCOUNTER — Encounter: Payer: Self-pay | Admitting: Radiation Oncology

## 2014-11-30 ENCOUNTER — Ambulatory Visit: Payer: PRIVATE HEALTH INSURANCE | Admitting: Radiation Oncology

## 2014-12-09 ENCOUNTER — Ambulatory Visit
Admission: RE | Admit: 2014-12-09 | Discharge: 2014-12-09 | Disposition: A | Discharge status: Home / Self Care | Payer: PRIVATE HEALTH INSURANCE | Source: Ambulatory Visit | Attending: Radiation Oncology | Admitting: Radiation Oncology

## 2014-12-09 ENCOUNTER — Encounter: Payer: Self-pay | Admitting: Radiation Oncology

## 2014-12-09 ENCOUNTER — Telehealth: Payer: Self-pay | Admitting: *Deleted

## 2014-12-09 VITALS — BP 139/92 | HR 70 | Temp 97.0°F | Wt 243.9 lb

## 2014-12-09 DIAGNOSIS — C023 Malignant neoplasm of anterior two-thirds of tongue, part unspecified: Secondary | ICD-10-CM

## 2014-12-09 NOTE — Progress Notes (Signed)
Radiation Oncology Follow up Note  Name: Ross Chandler   Date:   12/09/2014 MRN:  [id number] DOB: October 29, 1980    This 34 y.o. male presents to the clinic today for stage III (T1 N1 M0) squamous cell carcinoma of the tongue.Marland Kitchen  REFERRING PROVIDER: No ref. provider found  HPI: Patient is a 34 year old male now one month out having completed radiation therapy. To his tongue and lymph nodes for a stage III clinical staging squamous cell carcinoma the left lateral tongue. He is seen today in routine follow-up and is doing well. Taste has returned. He's having no problems with dysphagia or head and neck pain. He has not had a follow-up appointment with ENT at this time.  COMPLICATIONS OF TREATMENT: none  FOLLOW UP COMPLIANCE: keeps appointments   PHYSICAL EXAM:  BP 139/92 mmHg  Pulse 70  Temp(Src) 97 F (36.1 C)  Wt 243 lb 15 oz (110.65 kg) Oral cavity is clear no oral mucosal lesions are identified tongue is mobile. There is some scarring in the left lateral tongue which we would expect no ulceration or identify masses noted. Indirect mirror examination shows upper airway clear vallecula and base of tongue within normal limits. Neck is clear without evidence of subject gastric cervical or supraclavicular adenopathy. Well-developed well-nourished patient in NAD. HEENT reveals PERLA, EOMI, discs not visualized.  Oral cavity is clear. No oral mucosal lesions are identified. Neck is clear without evidence of cervical or supraclavicular adenopathy. Lungs are clear to A&P. Cardiac examination is essentially unremarkable with regular rate and rhythm without murmur rub or thrill. Abdomen is benign with no organomegaly or masses noted. Motor sensory and DTR levels are equal and symmetric in the upper and lower extremities. Cranial nerves II through XII are grossly intact. Proprioception is intact. No peripheral adenopathy or edema is identified. No motor or sensory levels are noted. Crude visual fields  are within normal range.  RADIOLOGY RESULTS: No current films for review  PLAN: At the present time he is doing well with no evidence of disease. I'm please was overall progress. He seems to have very little side effect profile at this time. I have set up a follow-up appointment with Dr. Ladene Artist. I have asked to see him back in 4-5 months for follow-up. May obtain a CT scan or PET CT scan at that time. Patient is to call sooner with any concerns.  I would like to take this opportunity for allowing me to participate in the care of your patient.Armstead Peaks., MD

## 2014-12-09 NOTE — Telephone Encounter (Signed)
Spoke with patient regarding a f/u appointment with Dr. Kathyrn Sheriff for October 21 at 1015am.   Patient repeated back the date and time; verbalized the importance of following up with Dr. Kathyrn Sheriff.

## 2015-02-01 ENCOUNTER — Other Ambulatory Visit: Payer: PRIVATE HEALTH INSURANCE

## 2015-02-01 ENCOUNTER — Ambulatory Visit: Payer: PRIVATE HEALTH INSURANCE | Admitting: Oncology

## 2015-02-09 ENCOUNTER — Other Ambulatory Visit: Payer: Self-pay | Admitting: *Deleted

## 2015-02-09 DIAGNOSIS — C029 Malignant neoplasm of tongue, unspecified: Secondary | ICD-10-CM

## 2015-02-16 ENCOUNTER — Inpatient Hospital Stay (HOSPITAL_BASED_OUTPATIENT_CLINIC_OR_DEPARTMENT_OTHER): Payer: PRIVATE HEALTH INSURANCE | Admitting: Oncology

## 2015-02-16 ENCOUNTER — Inpatient Hospital Stay: Payer: PRIVATE HEALTH INSURANCE | Attending: Oncology

## 2015-02-16 VITALS — BP 128/87 | HR 64 | Temp 95.8°F | Wt 255.7 lb

## 2015-02-16 DIAGNOSIS — Z923 Personal history of irradiation: Secondary | ICD-10-CM | POA: Insufficient documentation

## 2015-02-16 DIAGNOSIS — Z85810 Personal history of malignant neoplasm of tongue: Secondary | ICD-10-CM | POA: Insufficient documentation

## 2015-02-16 DIAGNOSIS — F17210 Nicotine dependence, cigarettes, uncomplicated: Secondary | ICD-10-CM

## 2015-02-16 DIAGNOSIS — I10 Essential (primary) hypertension: Secondary | ICD-10-CM | POA: Insufficient documentation

## 2015-02-16 DIAGNOSIS — C029 Malignant neoplasm of tongue, unspecified: Secondary | ICD-10-CM

## 2015-02-16 LAB — CBC WITH DIFFERENTIAL/PLATELET
Basophils Absolute: 0 10*3/uL (ref 0–0.1)
Basophils Relative: 1 %
Eosinophils Absolute: 0.1 10*3/uL (ref 0–0.7)
Eosinophils Relative: 3 %
HEMATOCRIT: 44.3 % (ref 40.0–52.0)
Hemoglobin: 15.3 g/dL (ref 13.0–18.0)
Lymphocytes Relative: 18 %
Lymphs Abs: 0.6 10*3/uL — ABNORMAL LOW (ref 1.0–3.6)
MCH: 31.7 pg (ref 26.0–34.0)
MCHC: 34.4 g/dL (ref 32.0–36.0)
MCV: 92.1 fL (ref 80.0–100.0)
MONO ABS: 0.5 10*3/uL (ref 0.2–1.0)
MONOS PCT: 13 %
Neutro Abs: 2.4 10*3/uL (ref 1.4–6.5)
Neutrophils Relative %: 65 %
Platelets: 213 10*3/uL (ref 150–440)
RBC: 4.81 MIL/uL (ref 4.40–5.90)
RDW: 13.1 % (ref 11.5–14.5)
WBC: 3.7 10*3/uL — ABNORMAL LOW (ref 3.8–10.6)

## 2015-02-16 LAB — COMPREHENSIVE METABOLIC PANEL
ALT: 20 U/L (ref 17–63)
ANION GAP: 7 (ref 5–15)
AST: 15 U/L (ref 15–41)
Albumin: 4.1 g/dL (ref 3.5–5.0)
Alkaline Phosphatase: 44 U/L (ref 38–126)
BILIRUBIN TOTAL: 0.8 mg/dL (ref 0.3–1.2)
BUN: 15 mg/dL (ref 6–20)
CO2: 23 mmol/L (ref 22–32)
Calcium: 8.5 mg/dL — ABNORMAL LOW (ref 8.9–10.3)
Chloride: 101 mmol/L (ref 101–111)
Creatinine, Ser: 0.98 mg/dL (ref 0.61–1.24)
Glucose, Bld: 106 mg/dL — ABNORMAL HIGH (ref 65–99)
Potassium: 4 mmol/L (ref 3.5–5.1)
Sodium: 131 mmol/L — ABNORMAL LOW (ref 135–145)
TOTAL PROTEIN: 6.9 g/dL (ref 6.5–8.1)

## 2015-02-16 NOTE — Progress Notes (Signed)
Ross Chandler  [phone]  Fax:([phone]) [phone]     Ross Chandler DOB: May 23, 1980  MR#: [id number]  HGD#:[id number]  Patient Care Team: No Pcp Per Patient as PCP - General (General Practice) [name], MD as Referring Physician (Otolaryngology)  CHIEF COMPLAINT:  Chief Complaint  Patient presents with  . Head and Neck   Oncology History   1.  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  Moderately differentiated.  Lymphovascular invasion present.  Margins are clear.  Total size of the tumor was 1.2 cm.  Moderately differentiated.  Diagnosis on now May of 2016 status post excision     Carcinoma of tongue ([name])   07/21/2014 Initial Diagnosis Carcinoma of tongue   INTERVAL HISTORY: Patient is here for further follow-up and treatment consideration regarding base of tongue cancer. Patient was originally diagnosed in May 2016 and is status post excision as well as completion of radiation therapy. Patient was noted on diagnosis to have a stage III, T1 N1 M0 disease. Patient reports overall feeling very well. He denies any complaints of dysphasia, pain, weight loss, taste changes. He has recently been evaluated by Dr. Baruch Gouty following completion of radiation therapy.  REVIEW OF SYSTEMS:   Review of Systems  Constitutional: Negative for fever, chills, weight loss, malaise/fatigue and diaphoresis.  HENT: Negative for congestion, nosebleeds and sore throat.   Eyes: Negative for blurred vision, double vision, photophobia, pain, discharge and redness.  Respiratory: Negative for cough, hemoptysis, sputum production, shortness of breath, wheezing and stridor.   Cardiovascular: Negative for chest pain, palpitations, orthopnea, claudication, leg swelling and PND.  Gastrointestinal: Negative for heartburn, nausea, vomiting, abdominal pain, diarrhea, constipation, blood in stool and melena.  Genitourinary: Negative.   Musculoskeletal: Negative.    Skin: Negative.   Neurological: Negative for dizziness, tingling, focal weakness, seizures and weakness.  Endo/Heme/Allergies: Negative for polydipsia. Does not bruise/bleed easily.  Psychiatric/Behavioral: Negative for depression. The patient is not nervous/anxious and does not have insomnia.     As per HPI. Otherwise, a complete review of systems is negatve.  ONCOLOGY HISTORY: Oncology History   1.  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  Moderately differentiated.  Lymphovascular invasion present.  Margins are clear.  Total size of the tumor was 1.2 cm.  Moderately differentiated.  Diagnosis on now May of 2016 status post excision     Carcinoma of tongue ([name])   07/21/2014 Initial Diagnosis Carcinoma of tongue    PAST MEDICAL HISTORY: Past Medical History  Diagnosis Date  . Squamous cell carcinoma of lateral tongue left  . Hypertension     in past    PAST SURGICAL HISTORY: Past Surgical History  Procedure Laterality Date  . Tympanostomy tube placement Bilateral 1982, 1986, 1987  . Tonsillectomy and adenoidectomy  1986  . Ear tube removal Right 1986  . Excision of tongue lesion Left 07/09/14    [facility]  . Excision nasal mass N/A 08/13/2014    Procedure: EXCISION NASAL MASS;  Surgeon: [name], MD;  Location: [facility];  Service: ENT;  Laterality: N/A;  . Direct laryngoscopy N/A 08/13/2014    Procedure: DIRECT LARYNGOSCOPY;  Surgeon: [name], MD;  Location: [facility];  Service: ENT;  Laterality: N/A;    FAMILY HISTORY No family history on file.  GYNECOLOGIC HISTORY:  No LMP for male patient.     ADVANCED DIRECTIVES:    HEALTH MAINTENANCE: Social History  Substance Use Topics  .  Smoking status: Current Every Day Smoker -- 1.00 packs/day    Types: Cigarettes  . Smokeless tobacco: Former Systems developer    Quit date: 03/22/2014  . Alcohol Use: 14.4 oz/week    24 Cans of beer per week     Colonoscopy:  PAP:  Bone  density:  Lipid panel:  No Known Allergies  No current outpatient prescriptions on file.   No current facility-administered medications for this visit.    OBJECTIVE: BP 128/87 mmHg  Pulse 64  Temp(Src) 95.8 F (35.4 C) (Tympanic)  Wt 255 lb 11.7 oz (116 kg)   Body mass index is 32.82 kg/(m^2).    ECOG FS:0 - Asymptomatic  General: Well-developed, well-nourished, no acute distress. Eyes: Pink conjunctiva, anicteric sclera. HEENT: Normocephalic, moist mucous membranes, clear oropharnyx. Lungs: Bilateral inspiratory wheezes throughout. Heart: Regular rate and rhythm. No rubs, murmurs, or gallops. Abdomen: Soft, nontender, nondistended. No organomegaly noted, normoactive bowel sounds. Musculoskeletal: No edema, cyanosis, or clubbing. Neuro: Alert, answering all questions appropriately. Cranial nerves grossly intact. Skin: No rashes or petechiae noted. Psych: Normal affect. Lymphatics: No cervical, clavicular LAD.   LAB RESULTS:  Appointment on 02/16/2015  Component Date Value Ref Range Status  . WBC 02/16/2015 3.7* 3.8 - 10.6 K/uL Final  . RBC 02/16/2015 4.81  4.40 - 5.90 MIL/uL Final  . Hemoglobin 02/16/2015 15.3  13.0 - 18.0 g/dL Final  . HCT 02/16/2015 44.3  40.0 - 52.0 % Final  . MCV 02/16/2015 92.1  80.0 - 100.0 fL Final  . MCH 02/16/2015 31.7  26.0 - 34.0 pg Final  . MCHC 02/16/2015 34.4  32.0 - 36.0 g/dL Final  . RDW 02/16/2015 13.1  11.5 - 14.5 % Final  . Platelets 02/16/2015 213  150 - 440 K/uL Final  . Neutrophils Relative % 02/16/2015 65   Final  . Neutro Abs 02/16/2015 2.4  1.4 - 6.5 K/uL Final  . Lymphocytes Relative 02/16/2015 18   Final  . Lymphs Abs 02/16/2015 0.6* 1.0 - 3.6 K/uL Final  . Monocytes Relative 02/16/2015 13   Final  . Monocytes Absolute 02/16/2015 0.5  0.2 - 1.0 K/uL Final  . Eosinophils Relative 02/16/2015 3   Final  . Eosinophils Absolute 02/16/2015 0.1  0 - 0.7 K/uL Final  . Basophils Relative 02/16/2015 1   Final  . Basophils Absolute  02/16/2015 0.0  0 - 0.1 K/uL Final  . Sodium 02/16/2015 131* 135 - 145 mmol/L Final  . Potassium 02/16/2015 4.0  3.5 - 5.1 mmol/L Final  . Chloride 02/16/2015 101  101 - 111 mmol/L Final  . CO2 02/16/2015 23  22 - 32 mmol/L Final  . Glucose, Bld 02/16/2015 106* 65 - 99 mg/dL Final  . BUN 02/16/2015 15  6 - 20 mg/dL Final  . Creatinine, Ser 02/16/2015 0.98  0.61 - 1.24 mg/dL Final  . Calcium 02/16/2015 8.5* 8.9 - 10.3 mg/dL Final  . Total Protein 02/16/2015 6.9  6.5 - 8.1 g/dL Final  . Albumin 02/16/2015 4.1  3.5 - 5.0 g/dL Final  . AST 02/16/2015 15  15 - 41 U/L Final  . ALT 02/16/2015 20  17 - 63 U/L Final  . Alkaline Phosphatase 02/16/2015 44  38 - 126 U/L Final  . Total Bilirubin 02/16/2015 0.8  0.3 - 1.2 mg/dL Final  . GFR calc non Af Amer 02/16/2015 >60  >60 mL/min Final  . GFR calc Af Amer 02/16/2015 >60  >60 mL/min Final   Comment: (NOTE) The eGFR has been calculated using the  CKD EPI equation. This calculation has not been validated in all clinical situations. eGFR's persistently <60 mL/min signify possible Chronic Kidney Disease.   . Anion gap 02/16/2015 7  5 - 15 Final    STUDIES: No results found.  ASSESSMENT:  Stage III left lateral base of tongue cancer, T1 N1 M0.  PLAN:   1. Base of tongue cancer. Patient was originally diagnosed with stage III disease with hypermetabolic submandibular lymph node on PET scan as well as evidence of lymphovascular invasion on resection. He then had subsequent adjuvant radiation therapy. Patient overall is doing very well. Clinically there is no evidence of recurrent disease. Patient has had no recent imaging following treatment. Radiation was completed in August. We'll obtain restaging PET scan by the end of December to evaluate for resolution of isolated hypermetabolic left submandibular node that was previously suspicious for metastatic disease and for reassessment of primary lesion.  Patient to return following PET scan for results  with Dr. Oliva Bustard.  Patient expressed understanding and was in agreement with this plan. He also understands that He can call clinic at any time with any questions, concerns, or complaints.   Dr. Oliva Bustard was available for consultation and review of plan of care for this patient.  Carcinoma of tongue ([place])   Staging form: Lip and Oral Cavity, AJCC 7th Edition     Clinical: Stage I (T1, N0, M0) - Signed by Forest Gleason, MD on 07/21/2014       Prognostic indicators: A moderately differentiated    Evlyn Kanner, NP   02/16/2015 10:45 AM

## 2015-02-16 NOTE — Progress Notes (Signed)
[place] @ [facility] Telephone:(336) [phone]  Fax:(336) [place]: December 29, 1980  MR#: [id number]  HUT#:[id number]  Patient Care Team: No Pcp Per Patient as PCP - General (General Practice) [name], MD as Referring Physician (Otolaryngology)  CHIEF COMPLAINT:  Chief Complaint  Patient presents with  . Head and Neck    Oncology History   1.  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  Moderately differentiated.  Lymphovascular invasion present.  Margins are clear.  Total size of the tumor was 1.2 cm.  Moderately differentiated.  Diagnosis on now May of 2016 status post excision     Carcinoma of tongue ([place])   07/21/2014 Initial Diagnosis Carcinoma of tongue    Oncology Flowsheet 08/13/2014  dexamethasone (DECADRON) IJ -  ondansetron (ZOFRAN) IJ -    INTERVAL HISTORY: 34 year old gentleman is a chronic smoker as well as deep snuff.  Found painful ulcer on the left lateral anterior portion of the tongue patient was evaluated by ENT surgeon underwent excision.  Wound is healing well.  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  Case was discussed in tumor conference.   REVIEW OF SYSTEMS:   GENERAL:  Feels good.  Active.  No fevers, sweats or weight loss. PERFORMANCE STATUS (ECOG):  0 HEENT:  No visual changes, runny nose, sore throat, mouth sores or tenderness. Lungs: No shortness of breath or cough.  No hemoptysis. Cardiac:  No chest pain, palpitations, orthopnea, or PND. GI:  No nausea, vomiting, diarrhea, constipation, melena or hematochezia. GU:  No urgency, frequency, dysuria, or hematuria. Musculoskeletal:  No back pain.  No joint pain.  No muscle tenderness. Extremities:  No pain or swelling. Skin:  No rashes or skin changes. Neuro:  No headache, numbness or weakness, balance or coordination  issues. Endocrine:  No diabetes, thyroid issues, hot flashes or night sweats. Psych:  No mood changes, depression or anxiety. Pain:  No focal pain. Review of systems:  All other systems reviewed and found to be negative.  As per HPI. Otherwise, a complete review of systems is negatve.  PAST MEDICAL HISTORY: Past Medical History  Diagnosis Date  . Squamous cell carcinoma of lateral tongue left  . Hypertension     in past    PAST SURGICAL HISTORY: Past Surgical History  Procedure Laterality Date  . Tympanostomy tube placement Bilateral 1982, 1986, 1987  . Tonsillectomy and adenoidectomy  1986  . Ear tube removal Right 1986  . Excision of tongue lesion Left 07/09/14    [facility]  . Excision nasal mass N/A 08/13/2014    Procedure: EXCISION NASAL MASS;  Surgeon: [name], MD;  Location: [place];  Service: ENT;  Laterality: N/A;  . Direct laryngoscopy N/A 08/13/2014    Procedure: DIRECT LARYNGOSCOPY;  Surgeon: [name], MD;  Location: [place];  Service: ENT;  Laterality: N/A;    FAMILY HISTORY No significant family history of colon cancer breast cancer or ovarian cancer      ADVANCED DIRECTIVES Patient does not have any advance care directive   HEALTH MAINTENANCE: Social History  Substance Use Topics  . Smoking status: Current Every Day Smoker -- 1.00 packs/day    Types: Cigarettes  . Smokeless tobacco: Former Systems developer    Quit date: 03/22/2014  . Alcohol Use: 14.4 oz/week    24 Cans of beer  per week      No Known Allergies  No current outpatient prescriptions on file.   No current facility-administered medications for this visit.    OBJECTIVE:  Filed Vitals:   02/16/15 1022  BP: 128/87  Pulse: 64  Temp: 95.8 F (35.4 C)     Body mass index is 32.82 kg/(m^2).    ECOG FS:0 - Asymptomatic  PHYSICAL EXAM:   GENERAL:  Well developed, well nourished, sitting comfortably in the exam room in no acute distress. MENTAL STATUS:  Alert and  oriented to person, place and time. HEAD:    Normocephalic, atraumatic, face symmetric, no Cushingoid features. EYES:  Pupils equal round and reactive to light and accomodation.  No conjunctivitis or scleral icterus. ENT:  Left anterior tongue wound is healing well.  No other palpable lymph nodes RESPIRATORY:  Clear to auscultation without rales, wheezes or rhonchi. CARDIOVASCULAR:  Regular rate and rhythm without murmur, rub or gallop.  ABDOMEN:  Soft, non-tender, with active bowel sounds, and no hepatosplenomegaly.  No masses. BACK:  No CVA tenderness.  No tenderness on percussion of the back or rib cage. SKIN:  No rashes, ulcers or lesions. EXTREMITIES: No edema, no skin discoloration or tenderness.  No palpable cords. LYMPH NODES: No palpable cervical, supraclavicular, axillary or inguinal adenopathy  NEUROLOGICAL: Unremarkable. PSYCH:  Appropriate. LAB RESULTS:  Appointment on 02/16/2015  Component Date Value Ref Range Status  . WBC 02/16/2015 3.7* 3.8 - 10.6 K/uL Final  . RBC 02/16/2015 4.81  4.40 - 5.90 MIL/uL Final  . Hemoglobin 02/16/2015 15.3  13.0 - 18.0 g/dL Final  . HCT 02/16/2015 44.3  40.0 - 52.0 % Final  . MCV 02/16/2015 92.1  80.0 - 100.0 fL Final  . MCH 02/16/2015 31.7  26.0 - 34.0 pg Final  . MCHC 02/16/2015 34.4  32.0 - 36.0 g/dL Final  . RDW 02/16/2015 13.1  11.5 - 14.5 % Final  . Platelets 02/16/2015 213  150 - 440 K/uL Final  . Neutrophils Relative % 02/16/2015 65   Final  . Neutro Abs 02/16/2015 2.4  1.4 - 6.5 K/uL Final  . Lymphocytes Relative 02/16/2015 18   Final  . Lymphs Abs 02/16/2015 0.6* 1.0 - 3.6 K/uL Final  . Monocytes Relative 02/16/2015 13   Final  . Monocytes Absolute 02/16/2015 0.5  0.2 - 1.0 K/uL Final  . Eosinophils Relative 02/16/2015 3   Final  . Eosinophils Absolute 02/16/2015 0.1  0 - 0.7 K/uL Final  . Basophils Relative 02/16/2015 1   Final  . Basophils Absolute 02/16/2015 0.0  0 - 0.1 K/uL Final  . Sodium 02/16/2015 131* 135 - 145  mmol/L Final  . Potassium 02/16/2015 4.0  3.5 - 5.1 mmol/L Final  . Chloride 02/16/2015 101  101 - 111 mmol/L Final  . CO2 02/16/2015 23  22 - 32 mmol/L Final  . Glucose, Bld 02/16/2015 106* 65 - 99 mg/dL Final  . BUN 02/16/2015 15  6 - 20 mg/dL Final  . Creatinine, Ser 02/16/2015 0.98  0.61 - 1.24 mg/dL Final  . Calcium 02/16/2015 8.5* 8.9 - 10.3 mg/dL Final  . Total Protein 02/16/2015 6.9  6.5 - 8.1 g/dL Final  . Albumin 02/16/2015 4.1  3.5 - 5.0 g/dL Final  . AST 02/16/2015 15  15 - 41 U/L Final  . ALT 02/16/2015 20  17 - 63 U/L Final  . Alkaline Phosphatase 02/16/2015 44  38 - 126 U/L Final  . Total Bilirubin 02/16/2015 0.8  0.3 - 1.2  mg/dL Final  . GFR calc non Af Amer 02/16/2015 >60  >60 mL/min Final  . GFR calc Af Amer 02/16/2015 >60  >60 mL/min Final   Comment: (NOTE) The eGFR has been calculated using the CKD EPI equation. This calculation has not been validated in all clinical situations. eGFR's persistently <60 mL/min signify possible Chronic Kidney Disease.   . Anion gap 02/16/2015 7  5 - 15 Final   ASSESSMENT: Carcinoma of tongue stage I disease.   MEDICAL DECISION MAKING:    PET scan has been reviewed independently.  There is some increased uptake in now base of the tongue which could be physiological.  I will discuss situation with Dr. Gibson Ramp about evaluating that area.  There is a submandibular lymph node which very well could be reactive as reviewed in a PET scan independently and reviewed with the radiologist sows a fatty hilum in the lymph node suggest you of normal lymph node.  During discussion in tumor conference possibility of firm radiation therapy to the ipsilateral neck area to cover the lymph node versus lymph node dissection and been discussed.  Patient would be referred to Dr. Baruch Gouty and now was discussed situation with Dr. Kathyrn Sheriff Carcinoma of tongue   Staging form: Lip and Oral Cavity, AJCC 7th Edition     Clinical: Stage I (T1, N0, M0) - Signed by  Forest Gleason, MD on 07/21/2014       Prognostic indicators: A moderately differentiated    Forest Gleason, MD   02/16/2015 10:31 AM

## 2015-03-11 ENCOUNTER — Ambulatory Visit
Admission: RE | Admit: 2015-03-11 | Discharge: 2015-03-11 | Disposition: A | Discharge status: Home / Self Care | Payer: PRIVATE HEALTH INSURANCE | Source: Ambulatory Visit | Attending: Family Medicine | Admitting: Family Medicine

## 2015-03-11 DIAGNOSIS — C029 Malignant neoplasm of tongue, unspecified: Secondary | ICD-10-CM | POA: Diagnosis not present

## 2015-03-11 DIAGNOSIS — N433 Hydrocele, unspecified: Secondary | ICD-10-CM | POA: Diagnosis not present

## 2015-03-11 DIAGNOSIS — J329 Chronic sinusitis, unspecified: Secondary | ICD-10-CM | POA: Insufficient documentation

## 2015-03-11 DIAGNOSIS — Z0189 Encounter for other specified special examinations: Secondary | ICD-10-CM | POA: Diagnosis present

## 2015-03-11 LAB — GLUCOSE, CAPILLARY: GLUCOSE-CAPILLARY: 86 mg/dL (ref 65–99)

## 2015-03-11 MED ORDER — FLUDEOXYGLUCOSE F - 18 (FDG) INJECTION
12.0400 | Freq: Once | INTRAVENOUS | Status: AC | PRN
  Administered 2015-03-11: 12.04 via INTRAVENOUS

## 2015-03-18 ENCOUNTER — Encounter: Payer: Self-pay | Admitting: Oncology

## 2015-03-18 ENCOUNTER — Inpatient Hospital Stay (HOSPITAL_BASED_OUTPATIENT_CLINIC_OR_DEPARTMENT_OTHER): Payer: PRIVATE HEALTH INSURANCE | Admitting: Oncology

## 2015-03-18 ENCOUNTER — Inpatient Hospital Stay: Payer: PRIVATE HEALTH INSURANCE | Attending: Oncology

## 2015-03-18 VITALS — BP 131/87 | HR 77 | Temp 97.0°F | Resp 18 | Wt 257.5 lb

## 2015-03-18 DIAGNOSIS — I10 Essential (primary) hypertension: Secondary | ICD-10-CM

## 2015-03-18 DIAGNOSIS — Z800 Family history of malignant neoplasm of digestive organs: Secondary | ICD-10-CM | POA: Diagnosis not present

## 2015-03-18 DIAGNOSIS — Z85810 Personal history of malignant neoplasm of tongue: Secondary | ICD-10-CM | POA: Diagnosis not present

## 2015-03-18 DIAGNOSIS — Z923 Personal history of irradiation: Secondary | ICD-10-CM | POA: Diagnosis not present

## 2015-03-18 DIAGNOSIS — C029 Malignant neoplasm of tongue, unspecified: Secondary | ICD-10-CM

## 2015-03-18 DIAGNOSIS — F17210 Nicotine dependence, cigarettes, uncomplicated: Secondary | ICD-10-CM | POA: Diagnosis not present

## 2015-03-18 LAB — TSH: TSH: 1.665 u[IU]/mL (ref 0.350–4.500)

## 2015-03-18 NOTE — Progress Notes (Signed)
[place] @ [facility] Telephone:(336) [phone]  Fax:(336) [place]: 13-Aug-1980  MR#: [id number]  [id number]  Patient Care Team: No Pcp Per Patient as PCP - General (General Practice) [name], MD as Referring Physician (Otolaryngology)  CHIEF COMPLAINT:  Chief Complaint  Patient presents with  . Tongue Cancer    Oncology History   1.  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  Moderately differentiated.  Lymphovascular invasion present.  Margins are clear.  Total size of the tumor was 1.2 cm.  Moderately differentiated.  Diagnosis on now May of 2016 status post excision     Carcinoma of tongue ([place])   07/21/2014 Initial Diagnosis Carcinoma of tongue  2.  Patient has finished radiation therapy    INTERVAL HISTORY: 35 year old gentleman is a chronic smoker  Patient has finished radiation therapy for stage I carcinoma of tongue. Patient continues to smoke.  No difficulty swallowing.  Had a repeat PET scan for restaging.  Here for further follow-up and treatment consideration REVIEW OF SYSTEMS:   GENERAL:  Feels good.  Active.  No fevers, sweats or weight loss. PERFORMANCE STATUS (ECOG):  0 HEENT:  No visual changes, runny nose, sore throat, mouth sores or tenderness. Lungs: No shortness of breath or cough.  No hemoptysis. Cardiac:  No chest pain, palpitations, orthopnea, or PND. GI:  No nausea, vomiting, diarrhea, constipation, melena or hematochezia. GU:  No urgency, frequency, dysuria, or hematuria. Musculoskeletal:  No back pain.  No joint pain.  No muscle tenderness. Extremities:  No pain or swelling. Skin:  No rashes or skin changes. Neuro:  No headache, numbness or weakness, balance or coordination issues. Endocrine:  No diabetes, thyroid issues, hot flashes or night sweats. Psych:  No mood changes, depression or anxiety. Pain:  No focal pain. Review of systems:  All other systems reviewed and found to be  negative.  As per HPI. Otherwise, a complete review of systems is negatve.  PAST MEDICAL HISTORY: Past Medical History  Diagnosis Date  . 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 Hypertension     in past    PAST SURGICAL HISTORY: Past Surgical History  Procedure Laterality Date  . Tympanostomy tube placement Bilateral 1982, 1986, 1987  . Tonsillectomy and adenoidectomy  1986  . Ear tube removal Right 1986  . Excision of tongue lesion Left 07/09/14    [facility]  . Excision nasal mass N/A 08/13/2014    Procedure: EXCISION NASAL MASS;  Surgeon: [name], MD;  Location: [place];  Service: ENT;  Laterality: N/A;  . Direct laryngoscopy N/A 08/13/2014    Procedure: DIRECT LARYNGOSCOPY;  Surgeon: [name], MD;  Location: [place];  Service: ENT;  Laterality: N/A;    FAMILY HISTORY No significant family history of colon cancer breast cancer or ovarian cancer      ADVANCED DIRECTIVES Patient does not have any advance care directive   HEALTH MAINTENANCE: Social History  Substance Use Topics  . Smoking status: Current Every Day Smoker -- 1.00 packs/day    Types: Cigarettes  . Smokeless tobacco: Former Systems developer    Quit date: 03/22/2014  . Alcohol Use: 14.4 oz/week    24 Cans of beer per week      No Known Allergies  No current outpatient prescriptions on file.   No current facility-administered medications for this visit.    OBJECTIVE:  Filed Vitals:   03/18/15 1138  BP: 131/87  Pulse: 77  Temp: 97 F (36.1 C)  Resp: 18     Body mass index is 33.05 kg/(m^2).    ECOG FS:0 - Asymptomatic  PHYSICAL EXAM:   GENERAL:  Well developed, well nourished, sitting comfortably in the exam room in no acute distress. MENTAL STATUS:  Alert and oriented to person, place and time. HEAD:    Normocephalic, atraumatic, face symmetric, no Cushingoid features. EYES:  Pupils equal round and reactive to light and accomodation.  No conjunctivitis or  scleral icterus. ENT:  Left anterior tongue wound is healing well.  No other palpable lymph nodes RESPIRATORY:  Clear to auscultation without rales, wheezes or rhonchi. CARDIOVASCULAR:  Regular rate and rhythm without murmur, rub or gallop.  ABDOMEN:  Soft, non-tender, with active bowel sounds, and no hepatosplenomegaly.  No masses. BACK:  No CVA tenderness.  No tenderness on percussion of the back or rib cage. SKIN:  No rashes, ulcers or lesions. EXTREMITIES: No edema, no skin discoloration or tenderness.  No palpable cords. LYMPH NODES: No palpable cervical, supraclavicular, axillary or inguinal adenopathy  NEUROLOGICAL: Unremarkable. PSYCH:  Appropriate. LAB RESULTS:  No visits with results within 3 Day(s) from this visit. Latest known visit with results is:  Hospital Outpatient Visit on 03/11/2015  Component Date Value Ref Range Status  . Glucose-Capillary 03/11/2015 86  65 - 99 mg/dL Final   ASSESSMENT: Carcinoma of tongue stage I disease.   MEDICAL DECISION MAKING:   PET scan of December, 2016 has been reviewed independently and reviewed with the patient.  There is complete resolution of tongue mass.  And submandibular lymph node.  Patient has been advised to quit smoking on the available resources has been offered.  Patient was advised to continue regular dental hygiene with frequent cleaning and fluoride treatment as needed  He will be discharged from my care will be followed by ENT surgeon and radiation oncologist Carcinoma of tongue   Staging form: Lip and Oral Cavity, AJCC 7th Edition     Clinical: Stage I (T1, N0, M0) - Signed by Forest Gleason, MD on 07/21/2014       Prognostic indicators: A moderately differentiated    Forest Gleason, MD   03/18/2015 11:43 AM

## 2015-03-19 ENCOUNTER — Encounter: Payer: Self-pay | Admitting: Oncology

## 2015-03-19 LAB — T4: T4, Total: 7.8 ug/dL (ref 4.5–12.0)

## 2015-04-23 ENCOUNTER — Ambulatory Visit: Payer: PRIVATE HEALTH INSURANCE | Admitting: Radiation Oncology

## 2015-04-29 ENCOUNTER — Ambulatory Visit: Payer: PRIVATE HEALTH INSURANCE | Attending: Radiation Oncology | Admitting: Radiation Oncology

## 2015-05-26 ENCOUNTER — Ambulatory Visit
Admission: EM | Admit: 2015-05-26 | Discharge: 2015-05-26 | Disposition: A | Discharge status: Home / Self Care | Payer: PRIVATE HEALTH INSURANCE | Attending: Family Medicine | Admitting: Family Medicine

## 2015-05-26 ENCOUNTER — Ambulatory Visit (INDEPENDENT_AMBULATORY_CARE_PROVIDER_SITE_OTHER): Payer: PRIVATE HEALTH INSURANCE

## 2015-05-26 DIAGNOSIS — J0100 Acute maxillary sinusitis, unspecified: Secondary | ICD-10-CM

## 2015-05-26 DIAGNOSIS — J40 Bronchitis, not specified as acute or chronic: Secondary | ICD-10-CM

## 2015-05-26 MED ORDER — DOXYCYCLINE HYCLATE 100 MG PO CAPS: 100.0000 mg | ORAL_CAPSULE | Freq: Two times a day (BID) | ORAL | Status: DC

## 2015-05-26 MED ORDER — ALBUTEROL SULFATE HFA 108 (90 BASE) MCG/ACT IN AERS: 2.0000 | INHALATION_SPRAY | RESPIRATORY_TRACT | Status: DC | PRN

## 2015-05-26 MED ORDER — PREDNISONE 50 MG PO TABS
60.0000 mg | ORAL_TABLET | Freq: Every day | ORAL | Status: DC
  Administered 2015-05-26: 60 mg via ORAL

## 2015-05-26 MED ORDER — IPRATROPIUM-ALBUTEROL 0.5-2.5 (3) MG/3ML IN SOLN
3.0000 mL | Freq: Four times a day (QID) | RESPIRATORY_TRACT | Status: DC
Start: 2015-05-26 — End: 2015-05-26
  Administered 2015-05-26: 3 mL via RESPIRATORY_TRACT

## 2015-05-26 MED ORDER — BENZONATATE 100 MG PO CAPS: 100.0000 mg | ORAL_CAPSULE | Freq: Three times a day (TID) | ORAL | Status: DC | PRN

## 2015-05-26 MED ORDER — PREDNISONE 10 MG PO TABS: ORAL_TABLET | ORAL | Status: DC

## 2015-05-26 MED ORDER — IPRATROPIUM-ALBUTEROL 0.5-2.5 (3) MG/3ML IN SOLN
3.0000 mL | Freq: Four times a day (QID) | RESPIRATORY_TRACT | Status: DC
  Administered 2015-05-26: 3 mL via RESPIRATORY_TRACT

## 2015-05-26 NOTE — Discharge Instructions (Signed)
Take medication as prescribed. Rest. Drink plenty of fluids. Take over-the-counter Tylenol as needed.  Established primary care physician for follow-up.  Return to Urgent care for new or worsening concerns.    How to Use an Inhaler Proper inhaler technique is very important. Good technique ensures that the medicine reaches the lungs. Poor technique results in depositing the medicine on the tongue and back of the throat rather than in the airways. If you do not use the inhaler with good technique, the medicine will not help you. STEPS TO FOLLOW IF USING AN INHALER WITHOUT AN EXTENSION TUBE  Remove the cap from the inhaler.  If you are using the inhaler for the first time, you will need to prime it. Shake the inhaler for 5 seconds and release four puffs into the air, away from your face. Ask your health care provider or pharmacist if you have questions about priming your inhaler.  Shake the inhaler for 5 seconds before each breath in (inhalation).  Position the inhaler so that the top of the canister faces up.  Put your index finger on the top of the medicine canister. Your thumb supports the bottom of the inhaler.  Open your mouth.  Either place the inhaler between your teeth and place your lips tightly around the mouthpiece, or hold the inhaler 1-2 inches away from your open mouth. If you are unsure of which technique to use, ask your health care provider.  Breathe out (exhale) normally and as completely as possible.  Press the canister down with your index finger to release the medicine.  At the same time as the canister is pressed, inhale deeply and slowly until your lungs are completely filled. This should take 4-6 seconds. Keep your tongue down.  Hold the medicine in your lungs for 5-10 seconds (10 seconds is best). This helps the medicine get into the small airways of your lungs.  Breathe out slowly, through pursed lips. Whistling is an example of pursed lips.  Wait at least  15-30 seconds between puffs. Continue with the above steps until you have taken the number of puffs your health care provider has ordered. Do not use the inhaler more than your health care provider tells you.  Replace the cap on the inhaler.  Follow the directions from your health care provider or the inhaler insert for cleaning the inhaler. STEPS TO FOLLOW IF USING AN INHALER WITH AN EXTENSION (SPACER)  Remove the cap from the inhaler.  If you are using the inhaler for the first time, you will need to prime it. Shake the inhaler for 5 seconds and release four puffs into the air, away from your face. Ask your health care provider or pharmacist if you have questions about priming your inhaler.  Shake the inhaler for 5 seconds before each breath in (inhalation).  Place the open end of the spacer onto the mouthpiece of the inhaler.  Position the inhaler so that the top of the canister faces up and the spacer mouthpiece faces you.  Put your index finger on the top of the medicine canister. Your thumb supports the bottom of the inhaler and the spacer.  Breathe out (exhale) normally and as completely as possible.  Immediately after exhaling, place the spacer between your teeth and into your mouth. Close your lips tightly around the spacer.  Press the canister down with your index finger to release the medicine.  At the same time as the canister is pressed, inhale deeply and slowly until your lungs  are completely filled. This should take 4-6 seconds. Keep your tongue down and out of the way.  Hold the medicine in your lungs for 5-10 seconds (10 seconds is best). This helps the medicine get into the small airways of your lungs. Exhale.  Repeat inhaling deeply through the spacer mouthpiece. Again hold that breath for up to 10 seconds (10 seconds is best). Exhale slowly. If it is difficult to take this second deep breath through the spacer, breathe normally several times through the spacer. Remove  the spacer from your mouth.  Wait at least 15-30 seconds between puffs. Continue with the above steps until you have taken the number of puffs your health care provider has ordered. Do not use the inhaler more than your health care provider tells you.  Remove the spacer from the inhaler, and place the cap on the inhaler.  Follow the directions from your health care provider or the inhaler insert for cleaning the inhaler and spacer. If you are using different kinds of inhalers, use your quick relief medicine to open the airways 10-15 minutes before using a steroid if instructed to do so by your health care provider. If you are unsure which inhalers to use and the order of using them, ask your health care provider, nurse, or respiratory therapist. If you are using a steroid inhaler, always rinse your mouth with water after your last puff, then gargle and spit out the water. Do not swallow the water. AVOID:  Inhaling before or after starting the spray of medicine. It takes practice to coordinate your breathing with triggering the spray.  Inhaling through the nose (rather than the mouth) when triggering the spray. HOW TO DETERMINE IF YOUR INHALER IS FULL OR NEARLY EMPTY You cannot know when an inhaler is empty by shaking it. A few inhalers are now being made with dose counters. Ask your health care provider for a prescription that has a dose counter if you feel you need that extra help. If your inhaler does not have a counter, ask your health care provider to help you determine the date you need to refill your inhaler. Write the refill date on a calendar or your inhaler canister. Refill your inhaler 7-10 days before it runs out. Be sure to keep an adequate supply of medicine. This includes making sure it is not expired, and that you have a spare inhaler.  SEEK MEDICAL CARE IF:   Your symptoms are only partially relieved with your inhaler.  You are having trouble using your inhaler.  You have some  increase in phlegm. SEEK IMMEDIATE MEDICAL CARE IF:   You feel little or no relief with your inhalers. You are still wheezing and are feeling shortness of breath or tightness in your chest or both.  You have dizziness, headaches, or a fast heart rate.  You have chills, fever, or night sweats.  You have a noticeable increase in phlegm production, or there is blood in the phlegm. MAKE SURE YOU:   Understand these instructions.  Will watch your condition.  Will get help right away if you are not doing well or get worse.   This information is not intended to replace advice given to you by your health care provider. Make sure you discuss any questions you have with your health care provider.   Document Released: 02/25/2000 Document Revised: 12/18/2012 Document Reviewed: 09/26/2012 Elsevier Interactive Patient Education 2016 Else[employer] Inc[employer]  Sinusitis, Adult Sinusitis is redness, soreness, and puffiness (inflammation) of the air pockets in  the bones of your face (sinuses). The redness, soreness, and puffiness can cause air and mucus to get trapped in your sinuses. This can allow germs to grow and cause an infection.  HOME CARE   Drink enough fluids to keep your pee (urine) clear or pale yellow.  Use a humidifier in your home.  Run a hot shower to create steam in the bathroom. Sit in the bathroom with the door closed. Breathe in the steam 3-4 times a day.  Put a warm, moist washcloth on your face 3-4 times a day, or as told by your doctor.  Use salt water sprays (saline sprays) to wet the thick fluid in your nose. This can help the sinuses drain.  Only take medicine as told by your doctor. GET HELP RIGHT AWAY IF:   Your pain gets worse.  You have very bad headaches.  You are sick to your stomach (nauseous).  You throw up (vomit).  You are very sleepy (drowsy) all the time.  Your face is puffy (swollen).  Your vision changes.  You have a stiff neck.  You have trouble  breathing. MAKE SURE YOU:   Understand these instructions.  Will watch your condition.  Will get help right away if you are not doing well or get worse.   This information is not intended to replace advice given to you by your health care provider. Make sure you discuss any questions you have with your health care provider.   Document Released: 08/16/2007 Document Revised: 03/20/2014 Document Reviewed: 10/03/2011 Elsevier Interactive Patient Education [employer].

## 2015-05-26 NOTE — ED Provider Notes (Signed)
[facility]  ____________________________________________  Time seen: Approximately 3:18 PM  I have reviewed the triage vital signs and the nursing notes.   HISTORY  Chief Complaint Breathing Problem   HPI Ross Chandler is a 35 y.o. male patient presents with a complaint of cough and chest congestion. Patient reports that he was recently diagnosed with the flu about 1.5 weeks ago. Patient reports that he started getting better but states that the cough and chest congestion has continued. Patient reports that he was treated with oral Tamiflu as well as Augmentin. Patient reports that his wife also recently had the flu. Denies fevers. Reports continues to eat and drink well. Denies recent fevers.   Patient reports her last 2-3 days he can hear himself wheezing. Patient states that his cough is also increased states that the cough increases when he is actively wheezing. States that the cough is a dry cough. Denies productive cough. Patient also reports some continued sinus congestion and sinus pressure. States clear rhinorrhea.  Denies chest pain or shortness of breath. Denies shortness of breath with ambulation. Patient also does report that he has a newborn at home, and reports that he is feeling tired as he is not getting much sleep at this time.   Denies chest pain, shortness of breath, dizziness, chest pain or shortness of breath with ambulation, extremity pain or swelling, vomiting, diarrhea, or fevers. Denies oral swelling, oral pain, or facial swelling.    Past Medical History  Diagnosis Date  . 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 Hypertension     in past    Patient Active Problem List   Diagnosis Date Noted  . Carcinoma of tongue ([place]) 07/21/2014    Past Surgical History  Procedure Laterality Date  . Tympanostomy tube placement Bilateral 1982, 1986, 1987  . Tonsillectomy and adenoidectomy  1986  . Ear tube removal Right 1986  . Excision  of tongue lesion Left 07/09/14    [facility]  . Excision nasal mass N/A 08/13/2014    Procedure: EXCISION NASAL MASS;  Surgeon: [name], MD;  Location: [facility];  Service: ENT;  Laterality: N/A;  . Direct laryngoscopy N/A 08/13/2014    Procedure: DIRECT LARYNGOSCOPY;  Surgeon: [name], MD;  Location: [place];  Service: ENT;  Laterality: N/A;    Current Outpatient Rx  Name  Route  Sig  Dispense  Refill  .           .           .           .             Allergies Review of patient's allergies indicates no known allergies.  History reviewed. No pertinent family history.  Social History Social History  Substance Use Topics  . Smoking status: Current Every Day Smoker -- 1.00 packs/day    Types: Cigarettes  . Smokeless tobacco: Former Systems developer    Quit date: 03/22/2014  . Alcohol Use: 14.4 oz/week    24 Cans of beer per week    Review of Systems Constitutional: No fever/chills Eyes: No visual changes. ENT: No sore throat. Positive runny nose, nasal congestion, sinus pressure and cough. Cardiovascular: Denies chest pain. Respiratory: Denies shortness of breath. Gastrointestinal: No abdominal pain.  No nausea, no vomiting.  No diarrhea.  No constipation. Genitourinary: Negative for dysuria. Musculoskeletal: Negative for back pain. Skin: Negative for rash. Neurological: Negative for headaches, focal weakness or  numbness.  10-point ROS otherwise negative.  ____________________________________________   PHYSICAL EXAM:  VITAL SIGNS: ED Triage Vitals  Enc Vitals Group     BP 05/26/15 1414 124/76 mmHg     Pulse Rate 05/26/15 1414 70     Resp 05/26/15 1414 17     Temp 05/26/15 1414 98.2 F (36.8 C)     Temp Source 05/26/15 1414 Oral     SpO2 05/26/15 1414 94 %     Weight 05/26/15 1414 265 lb (120.203 kg)     Height 05/26/15 1414 6\' 2"  (1.88 m)     Head Cir --      Peak Flow --      Pain Score 05/26/15 1417 0     Pain Loc --      Pain Edu? --       Excl. in [place]? --   Constitutional: Alert and oriented. Well appearing and in no acute distress. Eyes: Conjunctivae are normal. PERRL. EOMI. Head: Atraumatic.Mild  tenderness to palpation bilateral maxillary sinuses. No frontal sinus tenderness to palpation. No swelling. No erythema.   Ears: no erythema, normal TMs bilaterally.   Nose: nasal congestion with bilateral nasal turbinate erythema and edema.   Mouth/Throat: Mucous membranes are moist.  Oropharynx non-erythematous.No tonsillar swelling or exudate.  Neck: No stridor.  No cervical spine tenderness to palpation. Hematological/Lymphatic/Immunilogical: No cervical lymphadenopathy. Cardiovascular: Normal rate, regular rhythm. Grossly normal heart sounds.  Good peripheral circulation. Respiratory: Normal respiratory effort.  No retractions. Mild scattered wheezing, mild scattered rhonchi. Good air movement. Speaks in complete sentences. Ambulatory in room while speaking in complete sentences.  Gastrointestinal: Soft and nontender. No distention. Normal Bowel sounds. No CVA tenderness. Musculoskeletal: No lower or upper extremity tenderness nor edema.  Bilateral pedal pulses equal and easily palpated. No cervical, thoracic or lumbar tenderness to palpation. No calf tenderness bilaterally.  Neurologic:  Normal speech and language. No gross focal neurologic deficits are appreciated. No gait instability. Skin:  Skin is warm, dry and intact. No rash noted. Psychiatric: Mood and affect are normal. Speech and behavior are normal.  ____________________________________________   LABS (all labs ordered are listed, but only abnormal results are displayed)  Labs Reviewed - No data to display  RADIOLOGY  EXAM: CHEST 2 VIEW  COMPARISON: Chest x-ray of 07/31/2006  FINDINGS: No active infiltrate or effusion is seen. Mediastinal and hilar contours are unremarkable. There is some peribronchial thickening which may indicate bronchitis. The heart is  within normal limits in size. No bony abnormality is seen.  IMPRESSION: 1. No infiltrate or effusion. 2. Peribronchial thickening may indicate bronchitis.   Electronically Signed By: Ivar Drape M.D. On: 05/26/2015 16:07  I, Marylene Land, personally viewed and evaluated these images (plain radiographs) as part of my medical decision making, as well as reviewing the written report by the radiologist.    [place] / [place] / ED COURSE  Pertinent labs & imaging results that were available during my care of the patient were reviewed by me and considered in my medical decision making (see chart for details).  Well-appearing patient. No acute distress. No respiratory distress. Smiling and laughing during exam. Presenting for the complaints of 1.5 weeks of cough and congestion with cough that is increasing the last several days. Denies fevers. Denies chest pain or shortness of breath. Patient with mild scattered wheezing, mild scattered rhonchi, good air movement. Mild clear rhinorrhea. Suspect post influenza bronchitis and sinusitis, however will evaluate chest x-ray to evaluate for pneumonia.  Albuterol ipratropium nebs 2 and prednisone 60 mg orally 1 in urgent care.  Post albuterol nebs other treatments, wheezes fully resolved and patient reports feeling much improved. Chest x-ray no infiltrate or effusion, peribronchial thickening possibly indicative of bronchitis per radiologist. Will treat bronchitis and sinusitis with oral doxycycline, prn tessalon perles, prednisone, and prn albuterol inhaler. Encouraged rest, fluids, and close PCP follow up.   Discussed follow up with Primary care physician this week. Discussed follow up and return parameters to Urgent care or ER including shortness of breath, chest pain, fevers,  no resolution or any worsening concerns. Patient verbalized understanding and agreed to plan.   ____________________________________________   FINAL  CLINICAL IMPRESSION(S) / ED DIAGNOSES  Final diagnoses:  Bronchitis  Acute maxillary sinusitis, recurrence not specified      Note: This dictation was prepared with Dragon dictation along with smaller phrase technology. Any transcriptional errors that result from this process are unintentional.    Marylene Land, NP 05/28/15 [place], NP 05/28/15 1029

## 2015-05-26 NOTE — ED Notes (Signed)
Patient c/o trouble breathing which started at least 4 days ago.  He reports no chest pain, but states that he is getting over the flu as well.  Lastly, he tried a nebulizer treatment at home today which he said helped him slightly.

## 2016-10-05 IMAGING — CT NM PET TUM IMG INITIAL (PI) SKULL BASE T - THIGH
1 of 10 series · 1 of 25 positions shown · non-contrast
Comparison: None.

CLINICAL DATA: Initial treatment strategy for left-sided tongue
primary. Smoker..

EXAM:
NUCLEAR MEDICINE PET SKULL BASE TO THIGH
TECHNIQUE: 12.7 mCi F-18 FDG was injected intravenously. Full-ring PET imaging
was performed from the skull base to thigh after the radiotracer. CT
data was obtained and used for attenuation correction and anatomic
localization.
FASTING BLOOD GLUCOSE:  Value: 79 mg/dl

[Series 4: pet wb (ac) · axial · 5.0mm · 4.07mm/px · 1 of 368 slices shown]
[im 245/368]
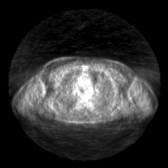

[1 of 25 positions shown; findings below may reference images not displayed]

FINDINGS: NECK

Hypermetabolism about the tongue base is minimally eccentric left.
Measures a S.U.V. max of 6.4, including on approximately image 68 of
series 3. No well-defined soft tissue mass in this area.

A left submandibular node measures 1.8 x 1.2 cm and a S.U.V. max of
4.0 on image 80. This maintains its fatty hilum, but is suspicious
based on size, location, and activity.

Hypermetabolism about the left side of the mandible is favored to be
either artifactual or due to dental inflammation. This measures a
S.U.V. max of 6.9, including on image 72.

CHEST

No areas of abnormal hypermetabolism.

ABDOMEN/PELVIS

No areas of abnormal hypermetabolism.

SKELETON

No abnormal marrow activity.

CT IMAGES PERFORMED FOR ATTENUATION CORRECTION

Mucosal thickening of bilateral maxillary sinuses. No jugular chain
adenopathy.

Borderline cardiomegaly. A small hiatal hernia. Mild centrilobular
emphysema.

Small Bilateral hydroceles. Bilateral L5 pars defects. T12
left-sided vertebral hemangioma.
IMPRESSION: 1. Isolated hypermetabolic left submandibular node, suspicious for
metastatic disease.
2. Tongue base hypermetabolism which could be physiologic or
represent the primary lesion.
3. No evidence of extracervical disease.
4. Incidental findings, including sinusitis, bilateral hydroceles,
and L5 pars defects.

## 2017-10-25 ENCOUNTER — Emergency Department
Admission: EM | Admit: 2017-10-25 | Discharge: 2017-10-26 | Disposition: A | Discharge status: Home / Self Care | Payer: PRIVATE HEALTH INSURANCE | Attending: Emergency Medicine | Admitting: Emergency Medicine

## 2017-10-25 DIAGNOSIS — K529 Noninfective gastroenteritis and colitis, unspecified: Secondary | ICD-10-CM | POA: Insufficient documentation

## 2017-10-25 DIAGNOSIS — Z79899 Other long term (current) drug therapy: Secondary | ICD-10-CM | POA: Diagnosis not present

## 2017-10-25 DIAGNOSIS — R1084 Generalized abdominal pain: Secondary | ICD-10-CM | POA: Diagnosis present

## 2017-10-25 DIAGNOSIS — I10 Essential (primary) hypertension: Secondary | ICD-10-CM | POA: Insufficient documentation

## 2017-10-25 DIAGNOSIS — F17210 Nicotine dependence, cigarettes, uncomplicated: Secondary | ICD-10-CM | POA: Diagnosis not present

## 2017-10-25 LAB — COMPREHENSIVE METABOLIC PANEL
ALT: 32 U/L (ref 0–44)
AST: 21 U/L (ref 15–41)
Albumin: 4.6 g/dL (ref 3.5–5.0)
Alkaline Phosphatase: 44 U/L (ref 38–126)
Anion gap: 6 (ref 5–15)
BUN: 6 mg/dL (ref 6–20)
CO2: 26 mmol/L (ref 22–32)
CREATININE: 0.85 mg/dL (ref 0.61–1.24)
Calcium: 8.9 mg/dL (ref 8.9–10.3)
Chloride: 104 mmol/L (ref 98–111)
GFR calc Af Amer: >60 mL/min (ref >60)
Glucose, Bld: 106 mg/dL — ABNORMAL HIGH (ref 70–99)
Potassium: 4.2 mmol/L (ref 3.5–5.1)
Sodium: 136 mmol/L (ref 135–145)
TOTAL PROTEIN: 7.9 g/dL (ref 6.5–8.1)
Total Bilirubin: 0.9 mg/dL (ref 0.3–1.2)

## 2017-10-25 LAB — CBC
HCT: 46.5 % (ref 40.0–52.0)
Hemoglobin: 16.7 g/dL (ref 13.0–18.0)
MCH: 33.3 pg (ref 26.0–34.0)
MCHC: 35.9 g/dL (ref 32.0–36.0)
MCV: 92.6 fL (ref 80.0–100.0)
Platelets: 262 10*3/uL (ref 150–440)
RBC: 5.02 MIL/uL (ref 4.40–5.90)
RDW: 13.2 % (ref 11.5–14.5)
WBC: 5.2 10*3/uL (ref 3.8–10.6)

## 2017-10-25 LAB — LIPASE, BLOOD: Lipase: 31 U/L (ref 11–51)

## 2017-10-25 NOTE — ED Triage Notes (Signed)
Patient c/o lower abdominal pain, N/V X 3 days. Patient denies diarrhea.

## 2017-10-26 LAB — URINALYSIS, COMPLETE (UACMP) WITH MICROSCOPIC
BILIRUBIN URINE: NEGATIVE
Bacteria, UA: NONE SEEN
Glucose, UA: NEGATIVE mg/dL
Hgb urine dipstick: NEGATIVE
Ketones, ur: 5 mg/dL — AB
LEUKOCYTES UA: NEGATIVE
NITRITE: NEGATIVE
PH: 5 (ref 5.0–8.0)
Protein, ur: NEGATIVE mg/dL
SQUAMOUS EPITHELIAL / LPF: NONE SEEN (ref 0–5)
Specific Gravity, Urine: 1.015 (ref 1.005–1.030)

## 2017-10-26 MED ORDER — SODIUM CHLORIDE 0.9 % IV BOLUS
1000.0000 mL | Freq: Once | INTRAVENOUS | Status: AC
  Administered 2017-10-26: 1000 mL via INTRAVENOUS

## 2017-10-26 MED ORDER — ONDANSETRON HCL 4 MG/2ML IJ SOLN
4.0000 mg | Freq: Once | INTRAMUSCULAR | Status: AC
  Administered 2017-10-26: 4 mg via INTRAVENOUS
  Filled 2017-10-26: qty 2

## 2017-10-26 MED ORDER — DICYCLOMINE HCL 20 MG PO TABS: 20.0000 mg | ORAL_TABLET | Freq: Three times a day (TID) | ORAL | 0 refills | Status: DC | PRN

## 2017-10-26 MED ORDER — DICYCLOMINE HCL 10 MG PO CAPS
10.0000 mg | ORAL_CAPSULE | Freq: Once | ORAL | Status: AC
  Administered 2017-10-26: 10 mg via ORAL
  Filled 2017-10-26: qty 1

## 2017-10-26 MED ORDER — ONDANSETRON HCL 4 MG PO TABS: 4.0000 mg | ORAL_TABLET | Freq: Three times a day (TID) | ORAL | 0 refills | Status: DC | PRN

## 2017-10-26 NOTE — Discharge Instructions (Addendum)
Please seek medical attention for any high fevers, chest pain, shortness of breath, change in behavior, persistent vomiting, bloody stool or any other new or concerning symptoms.  

## 2017-10-26 NOTE — ED Provider Notes (Signed)
[facility] Emergency Department Provider Note   ____________________________________________   I have reviewed the triage vital signs and the nursing notes.   HISTORY  Chief Complaint Abdominal Pain   History limited by: Not Limited   HPI Ross Chandler is a 37 y.o. male who presents to the emergency department today because of concern for abdominal pain. The patient states that the pain started three days ago. Described it as cramping. Located throughout the abdomen. Worse in the evenings. Thinks it might be related to chicken he ate the first and second day he was having the pain. Has had some associated nausea but no vomiting. Denies any change in urination. Denies any change in defecation. States that he has not had any fevers.    Per medical record review patient has a history of hypertension.  Past Medical History:  Diagnosis Date  . Hypertension    in past  . Squamous cell carcinoma of lateral tongue ([place]) left    Patient Active Problem List   Diagnosis Date Noted  . Carcinoma of tongue ([place]) 07/21/2014    Past Surgical History:  Procedure Laterality Date  . DIRECT LARYNGOSCOPY N/A 08/13/2014   Procedure: DIRECT LARYNGOSCOPY;  Surgeon: [name], MD;  Location: [place];  Service: ENT;  Laterality: N/A;  . EAR TUBE REMOVAL Right 1986  . EXCISION NASAL MASS N/A 08/13/2014   Procedure: EXCISION NASAL MASS;  Surgeon: [name], MD;  Location: [place];  Service: ENT;  Laterality: N/A;  . EXCISION OF TONGUE LESION Left 07/09/14   [facility]  . TONSILLECTOMY AND ADENOIDECTOMY  1986  . TYMPANOSTOMY TUBE PLACEMENT Bilateral 1982, [place]    Prior to Admission medications   Medication Sig Start Date End Date Taking? Authorizing Provider  albuterol (PROVENTIL HFA;VENTOLIN HFA) 108 (90 Base) MCG/ACT inhaler Inhale 2 puffs into the lungs every 4 (four) hours as needed. 05/26/15   Marylene Land, NP  benzonatate (TESSALON  PERLES) 100 MG capsule Take 1 capsule (100 mg total) by mouth 3 (three) times daily as needed for cough. 05/26/15   Marylene Land, NP  doxycycline (VIBRAMYCIN) 100 MG capsule Take 1 capsule (100 mg total) by mouth 2 (two) times daily. 05/26/15   Marylene Land, NP  predniSONE (DELTASONE) 10 MG tablet Start 60 mg po day one, then 50 mg po day two, taper by 10 mg daily until complete. 05/26/15   Marylene Land, NP    Allergies Patient has no known allergies.  No family history on file.  Social History Social History   Tobacco Use  . Smoking status: Current Every Day Smoker    Packs/day: 1.00    Types: Cigarettes  . Smokeless tobacco: Former Systems developer    Quit date: 03/22/2014  Substance Use Topics  . Alcohol use: Yes    Alcohol/week: 24.0 standard drinks    Types: 24 Cans of beer per week  . Drug use: No    Review of Systems Constitutional: No fever/chills Eyes: No visual changes. ENT: No sore throat. Cardiovascular: Denies chest pain. Respiratory: Denies shortness of breath. Gastrointestinal: Positive for abdominal cramping, nausea.  Genitourinary: Negative for dysuria. Musculoskeletal: Negative for back pain. Skin: Negative for rash. Neurological: Negative for headaches, focal weakness or numbness.  ____________________________________________   PHYSICAL EXAM:  VITAL SIGNS: ED Triage Vitals  Enc Vitals Group     BP 10/25/17 2224 (!) 148/102     Pulse Rate 10/25/17 2224 85     Resp 10/25/17 2224 19  Temp 10/25/17 2224 (!) 97.4 F (36.3 C)     Temp Source 10/25/17 2224 Oral     SpO2 10/25/17 2224 98 %     Weight 10/25/17 2225 270 lb (122.5 kg)     Height 10/25/17 2225 6\' 2"  (1.88 m)     Head Circumference --      Peak Flow --      Pain Score 10/25/17 2224 9   Constitutional: Alert and oriented.  Eyes: Conjunctivae are normal.  ENT      Head: Normocephalic and atraumatic.      Nose: No congestion/rhinnorhea.      Mouth/Throat: Mucous membranes are moist.       Neck: No stridor. Hematological/Lymphatic/Immunilogical: No cervical lymphadenopathy. Cardiovascular: Normal rate, regular rhythm.  No murmurs, rubs, or gallops.  Respiratory: Normal respiratory effort without tachypnea nor retractions. Breath sounds are clear and equal bilaterally. No wheezes/rales/rhonchi. Gastrointestinal: Soft and somewhat diffusely tender to palpation. No rebound. No guarding.  Genitourinary: Deferred Musculoskeletal: Normal range of motion in all extremities. No lower extremity edema. Neurologic:  Normal speech and language. No gross focal neurologic deficits are appreciated.  Skin:  Skin is warm, dry and intact. No rash noted. Psychiatric: Mood and affect are normal. Speech and behavior are normal. Patient exhibits appropriate insight and judgment.  ____________________________________________    LABS (pertinent positives/negatives)  CBC wbc 5.2, hgb 16.7, plt 262 Lipase 31 CMP wnl except glu 106 UA 5 ketones otherwise unremarkable  ____________________________________________   EKG  None  ____________________________________________    RADIOLOGY  None  ____________________________________________   PROCEDURES  Procedures  ____________________________________________   INITIAL IMPRESSION / ASSESSMENT AND PLAN / ED COURSE  Pertinent labs & imaging results that were available during my care of the patient were reviewed by me and considered in my medical decision making (see chart for details).   Presented to the emergency department today because of somewhat generalized abdominal pain, nausea and back pain.  On exam patient is somewhat diffusely tender to palpation.  Blood work without any concerning leukocytosis.  At this point I doubt focal infection such as cholecystitis or appendicitis.  Patient felt better after IV fluids and medications.  At this point I think gastroenteritis likely.  Will plan on discharging with medication.  Discussed  return precautions.   ____________________________________________   FINAL CLINICAL IMPRESSION(S) / ED DIAGNOSES  Final diagnoses:  Generalized abdominal pain  Gastroenteritis     Note: This dictation was prepared with Dragon dictation. Any transcriptional errors that result from this process are unintentional     Nance Pear, MD 10/26/17 [phone]

## 2018-01-02 ENCOUNTER — Emergency Department
Admission: EM | Admit: 2018-01-02 | Discharge: 2018-01-02 | Disposition: A | Discharge status: Home / Self Care | Payer: PRIVATE HEALTH INSURANCE | Attending: Emergency Medicine | Admitting: Emergency Medicine

## 2018-01-02 ENCOUNTER — Encounter: Payer: Self-pay | Admitting: Emergency Medicine

## 2018-01-02 ENCOUNTER — Other Ambulatory Visit: Payer: Self-pay

## 2018-01-02 DIAGNOSIS — H60502 Unspecified acute noninfective otitis externa, left ear: Secondary | ICD-10-CM | POA: Diagnosis not present

## 2018-01-02 DIAGNOSIS — F17210 Nicotine dependence, cigarettes, uncomplicated: Secondary | ICD-10-CM | POA: Diagnosis not present

## 2018-01-02 DIAGNOSIS — I10 Essential (primary) hypertension: Secondary | ICD-10-CM | POA: Diagnosis not present

## 2018-01-02 DIAGNOSIS — Z85810 Personal history of malignant neoplasm of tongue: Secondary | ICD-10-CM | POA: Insufficient documentation

## 2018-01-02 DIAGNOSIS — H9202 Otalgia, left ear: Secondary | ICD-10-CM | POA: Diagnosis present

## 2018-01-02 MED ORDER — NEOMYCIN-POLYMYXIN-HC 3.5-10000-1 OT SOLN: 3.0000 [drp] | Freq: Three times a day (TID) | OTIC | 0 refills | Status: AC

## 2018-01-02 MED ORDER — TRAMADOL HCL 50 MG PO TABS: 50.0000 mg | ORAL_TABLET | Freq: Four times a day (QID) | ORAL | 0 refills | Status: DC | PRN

## 2018-01-02 NOTE — ED Notes (Signed)
See triage note  Presents with left ear pain  States he developed left ear pain about 2-3 days ago  No fever  But states he noticed some drainage from ear

## 2018-01-02 NOTE — Discharge Instructions (Addendum)
Follow-up with [facility] if any continued problems with your ear.  The contact information is listed on your discharge papers.  Begin using the Cortisporin otic solution 3 drops to your left ear 3 times a day.  Also tramadol 50 mg every 6 hours as needed for pain.  You may not drive or operate machinery while taking this medication.

## 2018-01-02 NOTE — ED Triage Notes (Signed)
PT to ED c/o LFT ear drainage and pain xfew days. Denies fever

## 2018-01-02 NOTE — ED Provider Notes (Signed)
[facility] Emergency Department Provider Note   ____________________________________________   First MD Initiated Contact with Patient 01/02/18 1004     (approximate)  I have reviewed the triage vital signs and the nursing notes.   HISTORY  Chief Complaint Ear Drainage    HPI Ross Chandler is a 37 y.o. male presents to the ED with complaint of left ear pain and drainage for several days.  He denies any fever.  There is been no URI symptoms.  Patient has taken some over-the-counter medication for pain with minimal relief.  Currently he rates his pain as 10/10.   Past Medical History:  Diagnosis Date  . Hypertension    in past  . Squamous cell carcinoma of lateral tongue ([place]) left    Patient Active Problem List   Diagnosis Date Noted  . Carcinoma of tongue ([place]) 07/21/2014    Past Surgical History:  Procedure Laterality Date  . DIRECT LARYNGOSCOPY N/A 08/13/2014   Procedure: DIRECT LARYNGOSCOPY;  Surgeon: [name], MD;  Location: [place];  Service: ENT;  Laterality: N/A;  . EAR TUBE REMOVAL Right 1986  . EXCISION NASAL MASS N/A 08/13/2014   Procedure: EXCISION NASAL MASS;  Surgeon: [name], MD;  Location: [place];  Service: ENT;  Laterality: N/A;  . EXCISION OF TONGUE LESION Left 07/09/14   [facility]  . TONSILLECTOMY AND ADENOIDECTOMY  1986  . TYMPANOSTOMY TUBE PLACEMENT Bilateral 1982, [place]    Prior to Admission medications   Medication Sig Start Date End Date Taking? Authorizing Provider  albuterol (PROVENTIL HFA;VENTOLIN HFA) 108 (90 Base) MCG/ACT inhaler Inhale 2 puffs into the lungs every 4 (four) hours as needed. 05/26/15   Marylene Land, NP  neomycin-polymyxin-hydrocortisone (CORTISPORIN) OTIC solution Place 3 drops into the left ear 3 (three) times daily for 10 days. 01/02/18 01/12/18  Johnn Hai, PA-C  traMADol (ULTRAM) 50 MG tablet Take 1 tablet (50 mg total) by mouth every 6 (six) hours as  needed. 01/02/18   Johnn Hai, PA-C    Allergies Patient has no known allergies.  No family history on file.  Social History Social History   Tobacco Use  . Smoking status: Current Every Day Smoker    Packs/day: 1.00    Types: Cigarettes  . Smokeless tobacco: Former Systems developer    Quit date: 03/22/2014  Substance Use Topics  . Alcohol use: Yes    Alcohol/week: 24.0 standard drinks    Types: 24 Cans of beer per week  . Drug use: No    Review of Systems Constitutional: No fever/chills Eyes: No visual changes. ENT: Positive left ear pain and drainage. Cardiovascular: Denies chest pain. Respiratory: Denies shortness of breath. Musculoskeletal: Negative for muscle aches. Skin: Negative for rash. Neurological: Negative for headaches, focal weakness or numbness. ___________________________________________   PHYSICAL EXAM:  VITAL SIGNS: ED Triage Vitals  Enc Vitals Group     BP 01/02/18 0936 (!) 140/91     Pulse Rate 01/02/18 0936 84     Resp 01/02/18 0936 18     Temp 01/02/18 0936 98.4 F (36.9 C)     Temp Source 01/02/18 0936 Oral     SpO2 01/02/18 0936 98 %     Weight 01/02/18 0944 265 lb (120.2 kg)     Height 01/02/18 0944 6\' 2"  (1.88 m)     Head Circumference --      Peak Flow --      Pain Score 01/02/18 0943 10  Pain Loc --      Pain Edu? --      Excl. in [place]? --    Constitutional: Alert and oriented. Well appearing and in no acute distress. Eyes: Conjunctivae are normal.  Head: Atraumatic. Nose: No congestion/rhinnorhea.  Left EAC has yellow thick exudate present.  Canal on exam is extremely tender.  There is marked tenderness on palpation of the tragus.  TM is visible and nonerythematous. Mouth/Throat: Mucous membranes are moist.  Oropharynx non-erythematous. Neck: No stridor.   Hematological/Lymphatic/Immunilogical: No cervical lymphadenopathy. Cardiovascular: Normal rate, regular rhythm. Grossly normal heart sounds.  Good peripheral  circulation. Respiratory: Normal respiratory effort.  No retractions. Lungs CTAB. Musculoskeletal: Moves upper and lower extremities without any difficulty.  Normal gait was noted. Neurologic:  Normal speech and language. No gross focal neurologic deficits are appreciated. No gait instability. Skin:  Skin is warm, dry and intact. No rash noted. Psychiatric: Mood and affect are normal. Speech and behavior are normal.  ____________________________________________   LABS (all labs ordered are listed, but only abnormal results are displayed)  Labs Reviewed - No data to display  PROCEDURES  Procedure(s) performed: None  Procedures  Critical Care performed: No  ____________________________________________   INITIAL IMPRESSION / ASSESSMENT AND PLAN / ED COURSE  As part of my medical decision making, I reviewed the following data within the electronic MEDICAL RECORD NUMBER Notes from prior ED visits and [place] Controlled Substance Database  Patient presents to the ED with complaint of drainage from his left ear for several days.  He denies any fever, chills, nausea or vomiting.  He denies any upper respiratory symptoms.  Exam is consistent with an otitis externa.  Patient was given a prescription for Cortisporin otic solution and also tramadol if needed for pain.  Patient is aware that the pain medication could cause drowsiness and he should not be driving or operating machinery while taking this medication.  He is to follow-up with his PCP or [facility] acute care if any continued problems.  ____________________________________________   FINAL CLINICAL IMPRESSION(S) / ED DIAGNOSES  Final diagnoses:  Acute otitis externa of left ear, unspecified type     ED Discharge Orders         Ordered    neomycin-polymyxin-hydrocortisone (CORTISPORIN) OTIC solution  3 times daily     01/02/18 1015    traMADol (ULTRAM) 50 MG tablet  Every 6 hours PRN     01/02/18 1015           Note:   This document was prepared using Dragon voice recognition software and may include unintentional dictation errors.    Johnn Hai, PA-C 01/02/18 1328    Drenda Freeze, MD 01/02/18 [phone]

## 2019-04-01 ENCOUNTER — Other Ambulatory Visit: Payer: Self-pay

## 2019-04-01 ENCOUNTER — Emergency Department: Payer: Worker's Compensation

## 2019-04-01 ENCOUNTER — Encounter: Payer: Self-pay | Admitting: Emergency Medicine

## 2019-04-01 ENCOUNTER — Emergency Department
Admission: EM | Admit: 2019-04-01 | Discharge: 2019-04-01 | Disposition: A | Discharge status: Home / Self Care | Payer: Worker's Compensation | Attending: Emergency Medicine | Admitting: Emergency Medicine

## 2019-04-01 DIAGNOSIS — Y9289 Other specified places as the place of occurrence of the external cause: Secondary | ICD-10-CM | POA: Insufficient documentation

## 2019-04-01 DIAGNOSIS — S60221A Contusion of right hand, initial encounter: Secondary | ICD-10-CM | POA: Diagnosis not present

## 2019-04-01 DIAGNOSIS — S61210A Laceration without foreign body of right index finger without damage to nail, initial encounter: Secondary | ICD-10-CM | POA: Insufficient documentation

## 2019-04-01 DIAGNOSIS — W231XXA Caught, crushed, jammed, or pinched between stationary objects, initial encounter: Secondary | ICD-10-CM | POA: Diagnosis not present

## 2019-04-01 DIAGNOSIS — L089 Local infection of the skin and subcutaneous tissue, unspecified: Secondary | ICD-10-CM

## 2019-04-01 DIAGNOSIS — F17210 Nicotine dependence, cigarettes, uncomplicated: Secondary | ICD-10-CM | POA: Insufficient documentation

## 2019-04-01 DIAGNOSIS — Y9389 Activity, other specified: Secondary | ICD-10-CM | POA: Diagnosis not present

## 2019-04-01 DIAGNOSIS — Z85810 Personal history of malignant neoplasm of tongue: Secondary | ICD-10-CM | POA: Insufficient documentation

## 2019-04-01 DIAGNOSIS — Y990 Civilian activity done for income or pay: Secondary | ICD-10-CM | POA: Insufficient documentation

## 2019-04-01 DIAGNOSIS — S6991XA Unspecified injury of right wrist, hand and finger(s), initial encounter: Secondary | ICD-10-CM | POA: Diagnosis present

## 2019-04-01 DIAGNOSIS — Z23 Encounter for immunization: Secondary | ICD-10-CM | POA: Diagnosis not present

## 2019-04-01 DIAGNOSIS — I10 Essential (primary) hypertension: Secondary | ICD-10-CM | POA: Diagnosis not present

## 2019-04-01 MED ORDER — TETANUS-DIPHTH-ACELL PERTUSSIS 5-2.5-18.5 LF-MCG/0.5 IM SUSP
0.5000 mL | Freq: Once | INTRAMUSCULAR | Status: AC
  Administered 2019-04-01: 10:00:00 0.5 mL via INTRAMUSCULAR
  Filled 2019-04-01: qty 0.5

## 2019-04-01 MED ORDER — IBUPROFEN 600 MG PO TABS: 600.0000 mg | ORAL_TABLET | Freq: Three times a day (TID) | ORAL | 0 refills | Status: DC | PRN

## 2019-04-01 MED ORDER — TRAMADOL HCL 50 MG PO TABS: 50.0000 mg | ORAL_TABLET | Freq: Four times a day (QID) | ORAL | 0 refills | Status: DC | PRN

## 2019-04-01 MED ORDER — LIDOCAINE HCL (PF) 1 % IJ SOLN: 10.0000 mL | Freq: Once | INTRAMUSCULAR | Status: AC

## 2019-04-01 MED ORDER — SULFAMETHOXAZOLE-TRIMETHOPRIM 800-160 MG PO TABS: 1.0000 | ORAL_TABLET | Freq: Two times a day (BID) | ORAL | 0 refills | Status: DC

## 2019-04-01 MED ORDER — LIDOCAINE HCL (PF) 1 % IJ SOLN
INTRAMUSCULAR | Status: AC
  Administered 2019-04-01: 10 mL
  Filled 2019-04-01: qty 10

## 2019-04-01 NOTE — ED Provider Notes (Signed)
[facility] Emergency Department Provider Note   ____________________________________________   First MD Initiated Contact with Patient 04/01/19 [phone]     (approximate)  I have reviewed the triage vital signs and the nursing notes.   HISTORY  Chief Complaint Hand Pain    HPI Ross Chandler is a 39 y.o. male patient presents with right hand pain secondary to a crush incident at work this morning.  Patient is handling "between a rope and a pole that had to cut the road to get to fingers lose.  Patient denies loss of sensation or loss of function to the digits.  Patient sustained a laceration to his palmar aspect of the second digit.  Patient is right-hand dominant.  Tetanus shot is up-to-date.  Patient rates pain as a 10/10.  Prescribed pain is "achy".  No palliative measures prior to arrival.         Past Medical History:  Diagnosis Date  . Hypertension    in past  . 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 Carcinoma of tongue ([place]) 07/21/2014    Past Surgical History:  Procedure Laterality Date  . DIRECT LARYNGOSCOPY N/A 08/13/2014   Procedure: DIRECT LARYNGOSCOPY;  Surgeon: [name], MD;  Location: [facility];  Service: ENT;  Laterality: N/A;  . EAR TUBE REMOVAL Right 1986  . EXCISION NASAL MASS N/A 08/13/2014   Procedure: EXCISION NASAL MASS;  Surgeon: [name], MD;  Location: [facility];  Service: ENT;  Laterality: N/A;  . EXCISION OF TONGUE LESION Left 07/09/14   [facility]  . TONSILLECTOMY AND ADENOIDECTOMY  1986  . TYMPANOSTOMY TUBE PLACEMENT Bilateral 1982, [place]    Prior to Admission medications   Medication Sig Start Date End Date Taking? Authorizing Provider  albuterol (PROVENTIL HFA;VENTOLIN HFA) 108 (90 Base) MCG/ACT inhaler Inhale 2 puffs into the lungs every 4 (four) hours as needed. 05/26/15   Marylene Land, NP  ibuprofen (ADVIL)  600 MG tablet Take 1 tablet (600 mg total) by mouth every 8 (eight) hours as needed. 04/01/19   Sable Feil, PA-C  sulfamethoxazole-trimethoprim (BACTRIM DS) 800-160 MG tablet Take 1 tablet by mouth 2 (two) times daily. 04/01/19   Sable Feil, PA-C  traMADol (ULTRAM) 50 MG tablet Take 1 tablet (50 mg total) by mouth every 6 (six) hours as needed for moderate pain. 04/01/19   Sable Feil, PA-C    Allergies Patient has no known allergies.  No family history on file.  Social History Social History   Tobacco Use  . Smoking status: Current Every Day Smoker    Packs/day: 1.00    Types: Cigarettes  . Smokeless tobacco: Former Systems developer    Quit date: 03/22/2014  Substance Use Topics  . Alcohol use: Yes    Alcohol/week: 24.0 standard drinks    Types: 24 Cans of beer per week  . Drug use: No    Review of Systems  Constitutional: No fever/chills Eyes: No visual changes. ENT: No sore throat. Cardiovascular: Denies chest pain. Respiratory: Denies shortness of breath. Gastrointestinal: No abdominal pain.  No nausea, no vomiting.  No diarrhea.  No constipation. Genitourinary: Negative for dysuria. Musculoskeletal: Negative for back pain. Skin: Negative for rash.  Laceration second digit right hand. Neurological: Negative for headaches, focal weakness or numbness. Endocrine:  Hypertension.   ____________________________________________   PHYSICAL EXAM:  VITAL SIGNS: ED Triage Vitals [04/01/19 0818]  Enc Vitals Group     BP 117/88     Pulse Rate 100     Resp 16     Temp 98.1 F (36.7 C)     Temp Source Oral     SpO2 100 %     Weight 265 lb (120.2 kg)     Height 6\' 2"  (1.88 m)     Head Circumference      Peak Flow      Pain Score 10     Pain Loc      Pain Edu?      Excl. in [place]?     Constitutional: Alert and oriented. Well appearing and in no acute distress. ardiovascular: Normal rate, regular rhythm. Grossly normal heart sounds.  Good peripheral  circulation. Respiratory: Normal respiratory effort.  No retractions. Lungs CTAB. Musculoskeletal: No obvious deformity to the second and third digit of the right hand.  Patient has full equal range of motion.  Neurologic:  Normal speech and language. No gross focal neurologic deficits are appreciated. No gait instability. Skin: Laceration second digit right hand.  Psychiatric: Mood and affect are normal. Speech and behavior are normal.  ____________________________________________   LABS (all labs ordered are listed, but only abnormal results are displayed)  Labs Reviewed - No data to display ____________________________________________  EKG   ____________________________________________  RADIOLOGY  ED MD interpretation:    Official radiology report(s): DG Hand Complete Right  Result Date: 04/01/2019 CLINICAL DATA:  Work injury, caught RIGHT hand in the rope, had to have rope cut to get third and fourth fingers out, finger smashed EXAM: RIGHT HAND - COMPLETE 3+ VIEW COMPARISON:  None FINDINGS: Soft tissue swelling RIGHT hand greatest at middle finger. Osseous mineralization normal. Joint spaces preserved. No acute fracture, dislocation, or bone destruction. No radiopaque foreign bodies. IMPRESSION: Soft tissue swelling without acute osseous abnormalities. Electronically Signed   By: Lavonia Dana M.D.   On: 04/01/2019 08:48    ____________________________________________   PROCEDURES  Procedure(s) performed (including Critical Care):  Marland KitchenMarland KitchenLaceration Repair  Date/Time: 04/01/2019 9:26 AM Performed by: Sable Feil, PA-C Authorized by: Sable Feil, PA-C   Consent:    Consent obtained:  Verbal   Consent given by:  Patient   Risks discussed:  Infection, pain and poor cosmetic result Anesthesia (see MAR for exact dosages):    Anesthesia method:  Nerve block   Block anesthetic:  Lidocaine 1% w/o epi   Block injection procedure:  Anatomic landmarks identified, incremental  injection, anatomic landmarks palpated, introduced needle and negative aspiration for blood   Block outcome:  Anesthesia achieved Laceration details:    Location:  Finger   Finger location:  R index finger   Length (cm):  0.5   Depth (mm):  1 Repair type:    Repair type:  Simple Exploration:    Contaminated: yes   Treatment:    Area cleansed with:  Betadine and saline   Amount of cleaning:  Extensive   Irrigation solution:  Sterile saline   Irrigation method:  Syringe   Visualized foreign bodies/material removed: yes   Skin repair:    Repair method:  Tissue adhesive Approximation:    Approximation:  Close Post-procedure details:    Dressing:  Adhesive bandage   Patient tolerance of procedure:  Tolerated well, no immediate complications     ____________________________________________   INITIAL IMPRESSION / ASSESSMENT AND PLAN / ED COURSE  As part of my medical decision making, I reviewed the following data within  the electronic MEDICAL RECORD NUMBER     Patient presents for crush injury to the second and third digit of the right hand resulting in a laceration to the palmar aspect of the third digit right hand.  See procedure note for wound closure.  Discussed neck x-ray findings with patient.  Patient given discharge care instructions and advised to follow-up with company doctor per [employer].    Ross Chandler was evaluated in Emergency Department on 04/01/2019 for the symptoms described in the history of present illness. He was evaluated in the context of the global COVID-19 pandemic, which necessitated consideration that the patient might be at risk for infection with the SARS-CoV-2 virus that causes COVID-19. Institutional protocols and algorithms that pertain to the evaluation of patients at risk for COVID-19 are in a state of rapid change based on information released by regulatory bodies including the CDC and federal and state organizations. These policies and  algorithms were followed during the patient's care in the ED.       ____________________________________________   FINAL CLINICAL IMPRESSION(S) / ED DIAGNOSES  Final diagnoses:  Contusion of right hand, initial encounter  Laceration of finger with infection, initial encounter     ED Discharge Orders         Ordered    sulfamethoxazole-trimethoprim (BACTRIM DS) 800-160 MG tablet  2 times daily     04/01/19 0923    traMADol (ULTRAM) 50 MG tablet  Every 6 hours PRN     04/01/19 0923    ibuprofen (ADVIL) 600 MG tablet  Every 8 hours PRN     04/01/19 [id number]           Note:  This document was prepared using Dragon voice recognition software and may include unintentional dictation errors.    Sable Feil, PA-C 04/01/19 [id number]    Carrie Mew, MD 04/01/19 [phone]

## 2019-04-01 NOTE — Discharge Instructions (Signed)
Follow discharge care instruction.  Do not apply creams or ointment to Dermabond area.  Wear splint for 2 to 3 days as needed.  May remove for cleaning.

## 2019-04-01 NOTE — ED Triage Notes (Signed)
Pt here from work with c/o work injury, states his right hand got caught in rope this am and had to have the rope cut to get his fingers out. 3rd and 4th digits involved, both were "mashed really bad." Dried blood noted to hand. Nad.

## 2019-04-01 NOTE — ED Notes (Signed)
This RN spoke with pts boss, Jason Nest of [facility] [employer] requesting blood and urine drug tests. [phone].

## 2019-07-08 ENCOUNTER — Ambulatory Visit: Payer: PRIVATE HEALTH INSURANCE | Admitting: Surgery

## 2019-07-08 ENCOUNTER — Encounter: Payer: Self-pay | Admitting: Surgery

## 2019-07-08 ENCOUNTER — Other Ambulatory Visit: Payer: Self-pay

## 2019-07-08 VITALS — BP 133/85 | HR 76 | Temp 97.9°F | Resp 12 | Ht 74.0 in | Wt 265.6 lb

## 2019-07-08 DIAGNOSIS — E6609 Other obesity due to excess calories: Secondary | ICD-10-CM | POA: Insufficient documentation

## 2019-07-08 DIAGNOSIS — K429 Umbilical hernia without obstruction or gangrene: Secondary | ICD-10-CM | POA: Diagnosis not present

## 2019-07-08 DIAGNOSIS — Z6834 Body mass index (BMI) 34.0-34.9, adult: Secondary | ICD-10-CM | POA: Insufficient documentation

## 2019-07-08 NOTE — Patient Instructions (Signed)
Please consider to stop smoking and try loosing weight. When you decide to proceed with surgery please contact the office so we can make a follow up appointment.  Call the office if you have any questions or concerns.   Umbilical Hernia, Adult  A hernia is a bulge of tissue that pushes through an opening between muscles. An umbilical hernia happens in the abdomen, near the belly button (umbilicus). The hernia may contain tissues from the small intestine, large intestine, or fatty tissue covering the intestines (omentum). Umbilical hernias in adults tend to get worse over time, and they require surgical treatment. There are several types of umbilical hernias. You may have:  A hernia located just above or below the umbilicus (indirect hernia). This is the most common type of umbilical hernia in adults.  A hernia that forms through an opening formed by the umbilicus (direct hernia).  A hernia that comes and goes (reducible hernia). A reducible hernia may be visible only when you strain, lift something heavy, or cough. This type of hernia can be pushed back into the abdomen (reduced).  A hernia that traps abdominal tissue inside the hernia (incarcerated hernia). This type of hernia cannot be reduced.  A hernia that cuts off blood flow to the tissues inside the hernia (strangulated hernia). The tissues can start to die if this happens. This type of hernia requires emergency treatment. What are the causes? An umbilical hernia happens when tissue inside the abdomen presses on a weak area of the abdominal muscles. What increases the risk? You may have a greater risk of this condition if you:  Are obese.  Have had several pregnancies.  Have a buildup of fluid inside your abdomen (ascites).  Have had surgery that weakens the abdominal muscles. What are the signs or symptoms? The main symptom of this condition is a painless bulge at or near the belly button. A reducible hernia may be visible only  when you strain, lift something heavy, or cough. Other symptoms may include:  Dull pain.  A feeling of pressure. Symptoms of a strangulated hernia may include:  Pain that gets increasingly worse.  Nausea and vomiting.  Pain when pressing on the hernia.  Skin over the hernia becoming red or purple.  Constipation.  Blood in the stool. How is this diagnosed? This condition may be diagnosed based on:  A physical exam. You may be asked to cough or strain while standing. These actions increase the pressure inside your abdomen and force the hernia through the opening in your muscles. Your health care provider may try to reduce the hernia by pressing on it.  Your symptoms and medical history. How is this treated? Surgery is the only treatment for an umbilical hernia. Surgery for a strangulated hernia is done as soon as possible. If you have a small hernia that is not incarcerated, you may need to lose weight before having surgery. Follow these instructions at home:  Lose weight, if told by your health care provider.  Do not try to push the hernia back in.  Watch your hernia for any changes in color or size. Tell your health care provider if any changes occur.  You may need to avoid activities that increase pressure on your hernia.  Do not lift anything that is heavier than 10 lb (4.5 kg) until your health care provider says that this is safe.  Take over-the-counter and prescription medicines only as told by your health care provider.  Keep all follow-up visits as told  by your health care provider. This is important. Contact a health care provider if:  Your hernia gets larger.  Your hernia becomes painful. Get help right away if:  You develop sudden, severe pain near the area of your hernia.  You have pain as well as nausea or vomiting.  You have pain and the skin over your hernia changes color.  You develop a fever. This information is not intended to replace advice  given to you by your health care provider. Make sure you discuss any questions you have with your health care provider. Document Revised: 04/11/2017 Document Reviewed: 08/28/2016 Elsevier Patient Education  [place].

## 2019-07-08 NOTE — Progress Notes (Signed)
Patient ID: Ross Chandler, male   DOB: Aug 07, 1980, 39 y.o.   MRN: [id number]  Chief Complaint: Umbilical hernia  History of Present Illness Ross Chandler is a 39 y.o. male with umbilical hernia present for over a year.  He denies pain, gets uncomfortable at times with heavy lifting/pushing.  He has no history of fevers, chills, nausea, vomiting.  Reports a good appetite.  No prior surgical history regarding his abdomen.  There is been a slight increase in size of the lesion at the umbilicus.  Past Medical History Past Medical History:  Diagnosis Date  . Hypertension    in past  . Squamous cell carcinoma of lateral tongue ([facility]) left      Past Surgical History:  Procedure Laterality Date  . DIRECT LARYNGOSCOPY N/A 08/13/2014   Procedure: DIRECT LARYNGOSCOPY;  Surgeon: [name], MD;  Location: [place];  Service: ENT;  Laterality: N/A;  . EAR TUBE REMOVAL Right 1986  . EXCISION NASAL MASS N/A 08/13/2014   Procedure: EXCISION NASAL MASS;  Surgeon: [name], MD;  Location: [facility];  Service: ENT;  Laterality: N/A;  . EXCISION OF TONGUE LESION Left 07/09/14   [facility]  . TONSILLECTOMY AND ADENOIDECTOMY  1986  . TYMPANOSTOMY TUBE PLACEMENT Bilateral 1982, 1986, 1987    No Known Allergies  Current Outpatient Medications  Medication Sig Dispense Refill  . albuterol (PROVENTIL HFA;VENTOLIN HFA) 108 (90 Base) MCG/ACT inhaler Inhale 2 puffs into the lungs every 4 (four) hours as needed. 1 Inhaler 0   No current facility-administered medications for this visit.    Family History Family History  Problem Relation Age of Onset  . Cancer Mother       Social History Social History   Tobacco Use  . Smoking status: Current Every Day Smoker    Packs/day: 1.00    Types: Cigarettes  . Smokeless tobacco: Former Systems developer    Quit date: 03/22/2014  Substance Use Topics  . Alcohol use: Yes    Alcohol/week: 24.0 standard drinks    Types: 24 Cans of beer per week   . Drug use: No        Review of Systems  Constitutional: Negative for weight loss.  HENT: Negative.   Eyes: Negative.   Respiratory: Negative for cough and wheezing.   Cardiovascular: Negative for chest pain.  Gastrointestinal: Negative.   Genitourinary: Negative.   Musculoskeletal: Negative.   Skin: Negative for itching and rash.  Neurological: Negative.   Endo/Heme/Allergies: Negative.       Physical Exam Blood pressure 133/85, pulse 76, temperature 97.9 F (36.6 C), resp. rate 12, height 6\' 2"  (1.88 m), weight 265 lb 9.6 oz (120.5 kg), SpO2 97 %. Last Weight  Most recent update: 07/08/2019 10:31 AM   Weight  120.5 kg (265 lb 9.6 oz)            CONSTITUTIONAL: Well developed, and nourished, appropriately responsive and aware without distress.   EYES: Sclera non-icteric.   EARS, NOSE, MOUTH AND THROAT: Mask worn.    Hearing is intact to voice.  NECK: Trachea is midline, and there is no jugular venous distension.  LYMPH NODES:  Lymph nodes in the neck are not enlarged. RESPIRATORY:  Lungs are clear, and breath sounds are equal bilaterally. Normal respiratory effort without pathologic use of accessory muscles. CARDIOVASCULAR: Heart is regular in rate and rhythm. GI: There is an asymmetric intraumbilical bulge, readily reducible, nontender.  The appreciated fascial defect is no more than 1 cm.  Otherwise the abdomen is soft, nontender, and nondistended. There were no palpable masses. I did not appreciate hepatosplenomegaly. There were normal bowel sounds.  MUSCULOSKELETAL:  Symmetrical muscle tone appreciated in all four extremities.    SKIN: Skin turgor is normal. No pathologic skin lesions appreciated.  NEUROLOGIC:  Motor and sensation appear grossly normal.  Cranial nerves are grossly without defect. PSYCH:  Alert and oriented to person, place and time. Affect is appropriate for situation.  Data Reviewed I have personally reviewed what is currently available of the  patient's imaging, recent labs and medical records.   Labs:  CBC Latest Ref Rng & Units 10/25/2017 02/16/2015 10/19/2014  WBC 3.8 - 10.6 K/uL 5.2 3.7(L) 4.7  Hemoglobin 13.0 - 18.0 g/dL 16.7 15.3 16.3  Hematocrit 40.0 - 52.0 % 46.5 44.3 47.4  Platelets 150 - 440 K/uL 262 213 244   CMP Latest Ref Rng & Units 10/25/2017 02/16/2015 09/29/2014  Glucose 70 - 99 mg/dL 106(H) 106(H) 107(H)  BUN 6 - 20 mg/dL 6 15 9   Creatinine 0.61 - 1.24 mg/dL 0.85 0.98 0.95  Sodium 135 - 145 mmol/L 136 131(L) 136  Potassium 3.5 - 5.1 mmol/L 4.2 4.0 3.6  Chloride 98 - 111 mmol/L 104 101 103  CO2 22 - 32 mmol/L 26 23 25   Calcium 8.9 - 10.3 mg/dL 8.9 8.5(L) 8.7(L)  Total Protein 6.5 - 8.1 g/dL 7.9 6.9 7.6  Total Bilirubin 0.3 - 1.2 mg/dL 0.9 0.8 1.1  Alkaline Phos 38 - 126 U/L 44 44 58  AST 15 - 41 U/L 21 15 19   ALT 0 - 44 U/L 32 20 20      Imaging:  Within last 24 hrs: No results found.  Assessment    Umbilical hernia, size of fascial defect about 1 cm.  Readily reducible of either preperitoneal fat or omental fat.  Nontender. Patient Active Problem List   Diagnosis Date Noted  . Carcinoma of tongue ([place]) 07/21/2014    Plan   We discussed umbilical hernia repair, and the recommended option of sutured repair without mesh.  Not utilizing mesh either via open implantation or via laparoscopic/robotic.  Due to the small size of the umbilical defect, and the absence of diastases recti.  We discussed the role of postoperative recovery also in enabling strong healing and avoiding recurrence.  Tobacco cessation: We discussed the benefit for patients in postoperative care, having eliminated smoking preoperatively. Weight loss: We discussed the role of the diminishing his overweight status, and improving/diminishing his risks with potential hernia recurrence.  I believe he and his wife are both highly motivated and desire to pursue this course, and will contact us when prepared for proceeding with an operation.  He  is quite relieved, having concerns that there were more life-threatening things involved. We did discuss the risk of incarceration and I believe they understand the urgency should that arise.  Face-to-face time spent with the patient and accompanying care providers(if present) was 30 minutes, with more than 50% of the time spent counseling, educating, and coordinating care of the patient.      Ronny Bacon M.D., FACS 07/08/2019, 10:51 AM

## 2020-02-03 ENCOUNTER — Encounter: Payer: Self-pay | Admitting: Emergency Medicine

## 2020-02-03 ENCOUNTER — Ambulatory Visit
Admission: EM | Admit: 2020-02-03 | Discharge: 2020-02-03 | Disposition: A | Discharge status: Home / Self Care | Payer: PRIVATE HEALTH INSURANCE | Attending: Family Medicine | Admitting: Family Medicine

## 2020-02-03 ENCOUNTER — Ambulatory Visit (INDEPENDENT_AMBULATORY_CARE_PROVIDER_SITE_OTHER): Payer: PRIVATE HEALTH INSURANCE

## 2020-02-03 ENCOUNTER — Other Ambulatory Visit: Payer: Self-pay

## 2020-02-03 DIAGNOSIS — R0981 Nasal congestion: Secondary | ICD-10-CM | POA: Insufficient documentation

## 2020-02-03 DIAGNOSIS — R0602 Shortness of breath: Secondary | ICD-10-CM | POA: Diagnosis not present

## 2020-02-03 DIAGNOSIS — R5383 Other fatigue: Secondary | ICD-10-CM | POA: Insufficient documentation

## 2020-02-03 DIAGNOSIS — R062 Wheezing: Secondary | ICD-10-CM | POA: Diagnosis not present

## 2020-02-03 DIAGNOSIS — J209 Acute bronchitis, unspecified: Secondary | ICD-10-CM | POA: Diagnosis not present

## 2020-02-03 DIAGNOSIS — F17210 Nicotine dependence, cigarettes, uncomplicated: Secondary | ICD-10-CM | POA: Insufficient documentation

## 2020-02-03 DIAGNOSIS — R059 Cough, unspecified: Secondary | ICD-10-CM | POA: Diagnosis present

## 2020-02-03 DIAGNOSIS — Z20822 Contact with and (suspected) exposure to covid-19: Secondary | ICD-10-CM | POA: Insufficient documentation

## 2020-02-03 DIAGNOSIS — H9209 Otalgia, unspecified ear: Secondary | ICD-10-CM | POA: Insufficient documentation

## 2020-02-03 LAB — RESP PANEL BY RT-PCR (FLU A&B, COVID) ARPGX2
Influenza A by PCR: NEGATIVE
Influenza B by PCR: NEGATIVE
SARS Coronavirus 2 by RT PCR: NEGATIVE

## 2020-02-03 MED ORDER — ALBUTEROL SULFATE HFA 108 (90 BASE) MCG/ACT IN AERS: 1.0000 | INHALATION_SPRAY | Freq: Four times a day (QID) | RESPIRATORY_TRACT | 0 refills | Status: DC | PRN

## 2020-02-03 MED ORDER — PREDNISONE 20 MG PO TABS: 40.0000 mg | ORAL_TABLET | Freq: Every day | ORAL | 0 refills | Status: AC

## 2020-02-03 MED ORDER — CHERATUSSIN AC 100-10 MG/5ML PO SOLN: 10.0000 mL | Freq: Four times a day (QID) | ORAL | 0 refills | Status: AC | PRN

## 2020-02-03 NOTE — ED Triage Notes (Signed)
Pt c/o cough, shortness of breath, wheezing, bilateral ear pressure, cough, and nasal congestion. Started about a week ago. He states the nasal congestion has gotten better but now he has chest congestion and shortness of breath. He has not had covid vaccines.

## 2020-02-03 NOTE — ED Provider Notes (Signed)
[facility]    CSN: [id number] Arrival date & time: 02/03/20  1345      History   Chief Complaint Chief Complaint  Patient presents with  . Cough  . Shortness of Breath  . Otalgia    HPI Ross Chandler is a 39 y.o. male presenting for 1-1/2-week history of congestion and cough.  He states that his symptoms started out with a lot of sinus congestion and pressure along with ear pressure.  He states that over the past few days he feels like the congestion is moved into his chest and has had a productive cough.  Patient says that he has had shortness of breath and wheezing over the past couple of days.  He denies any associated fever.  Admits to some fatigue.  Denies any chest pain.  No abdominal pain, nausea, vomiting or diarrhea.  No changes in smell or taste.  Patient denies any known Covid exposure.  Not vaccinated for Covid.  Has not taken any over-the-counter medication for symptoms.  Patient states that he had asthma as a child, but has not had asthma as an adult.  He states that he did have a similar condition last year and was given an inhaler and breathing treatments that helped his symptoms.  Patient has no other complaints or concerns today.  HPI  Past Medical History:  Diagnosis Date  . Hypertension    in past  . 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 Umbilical hernia without obstruction and without gangrene 07/08/2019  . Class 1 obesity due to excess calories without serious comorbidity with body mass index (BMI) of 34.0 to 34.9 in adult 07/08/2019  . Carcinoma of tongue ([place]) 07/21/2014    Past Surgical History:  Procedure Laterality Date  . DIRECT LARYNGOSCOPY N/A 08/13/2014   Procedure: DIRECT LARYNGOSCOPY;  Surgeon: [name], MD;  Location: [place];  Service: ENT;  Laterality: N/A;  . EAR TUBE REMOVAL Right 1986  . EXCISION NASAL MASS N/A 08/13/2014   Procedure:  EXCISION NASAL MASS;  Surgeon: [name], MD;  Location: [place];  Service: ENT;  Laterality: N/A;  . EXCISION OF TONGUE LESION Left 07/09/14   [facility]  . TONSILLECTOMY AND ADENOIDECTOMY  1986  . TYMPANOSTOMY TUBE PLACEMENT Bilateral 1982, [place] Medications    Prior to Admission medications   Medication Sig Start Date End Date Taking? Authorizing Provider  albuterol (VENTOLIN HFA) 108 (90 Base) MCG/ACT inhaler Inhale 1-2 puffs into the lungs every 6 (six) hours as needed for wheezing or shortness of breath. 02/03/20 03/04/20  Danton Clap, PA-C  guaiFENesin-codeine (CHERATUSSIN AC) 100-10 MG/5ML syrup Take 10 mLs by mouth 4 (four) times daily as needed for up to 10 days for cough. 02/03/20 02/13/20  Danton Clap, PA-C  predniSONE (DELTASONE) 20 MG tablet Take 2 tablets (40 mg total) by mouth daily for 5 days. 02/03/20 02/08/20  Danton Clap, PA-C    Family History Family History  Problem Relation Age of Onset  . Cancer Mother     Social History Social History   Tobacco Use  . Smoking status: Current Every Day Smoker    Packs/day: 1.00    Types: Cigarettes  . Smokeless tobacco: Former Systems developer    Quit date: 03/22/2014  Vaping Use  . Vaping Use: Never used  Substance Use Topics  .  Alcohol use: Yes    Alcohol/week: 24.0 standard drinks    Types: 24 Cans of beer per week  . Drug use: No     Allergies   Patient has no known allergies.   Review of Systems Review of Systems  Constitutional: Positive for fatigue. Negative for fever.  HENT: Positive for congestion, rhinorrhea and sinus pressure. Negative for sinus pain and sore throat.   Respiratory: Positive for cough, shortness of breath and wheezing. Negative for chest tightness.   Cardiovascular: Negative for chest pain.  Gastrointestinal: Negative for abdominal pain, diarrhea, nausea and vomiting.  Musculoskeletal: Negative for myalgias.  Neurological: Positive for headaches. Negative  for weakness and light-headedness.  Hematological: Negative for adenopathy.     Physical Exam Triage Vital Signs ED Triage Vitals  Enc Vitals Group     BP 02/03/20 1405 (!) 152/101     Pulse Rate 02/03/20 1405 75     Resp 02/03/20 1405 20     Temp 02/03/20 1405 98.1 F (36.7 C)     Temp Source 02/03/20 1405 Oral     SpO2 02/03/20 1405 99 %     Weight 02/03/20 1403 265 lb 10.5 oz (120.5 kg)     Height 02/03/20 1403 6\' 2"  (1.88 m)     Head Circumference --      Peak Flow --      Pain Score 02/03/20 1403 0     Pain Loc --      Pain Edu? --      Excl. in [place]? --    No data found.  Updated Vital Signs BP (!) 152/101 (BP Location: Right Arm)   Pulse 75   Temp 98.1 F (36.7 C) (Oral)   Resp 20   Ht 6\' 2"  (1.88 m)   Wt 265 lb 10.5 oz (120.5 kg)   SpO2 99%   BMI 34.11 kg/m       Physical Exam Vitals and nursing note reviewed.  Constitutional:      General: He is not in acute distress.    Appearance: Normal appearance. He is well-developed. He is not ill-appearing, toxic-appearing or diaphoretic.  HENT:     Head: Normocephalic and atraumatic.     Right Ear: Tympanic membrane, ear canal and external ear normal.     Left Ear: Ear canal and external ear normal. Tympanic membrane is injected and retracted.     Nose: Congestion and rhinorrhea present.     Mouth/Throat:     Pharynx: Uvula midline. Posterior oropharyngeal erythema present. No oropharyngeal exudate.     Tonsils: No tonsillar abscesses.  Eyes:     General: No scleral icterus.       Right eye: No discharge.        Left eye: No discharge.     Conjunctiva/sclera: Conjunctivae normal.  Neck:     Thyroid: No thyromegaly.     Trachea: No tracheal deviation.  Cardiovascular:     Rate and Rhythm: Normal rate and regular rhythm.     Heart sounds: Normal heart sounds.  Pulmonary:     Effort: Pulmonary effort is normal. No respiratory distress.     Breath sounds: No stridor. Wheezing (diffuse wheezing and rhonchi  throughout bilateral lungs) and rhonchi present. No rales.  Chest:     Chest wall: No tenderness.  Musculoskeletal:     Cervical back: Normal range of motion and neck supple.  Skin:    General: Skin is warm and dry.     Findings:  No rash.  Neurological:     General: No focal deficit present.     Mental Status: He is alert. Mental status is at baseline.     Motor: No weakness.     Gait: Gait normal.  Psychiatric:        Mood and Affect: Mood normal.        Behavior: Behavior normal.        Thought Content: Thought content normal.      UC Treatments / Results  Labs (all labs ordered are listed, but only abnormal results are displayed) Labs Reviewed  RESP PANEL BY RT-PCR (FLU A&B, COVID) [id number]    EKG   Radiology DG Chest 2 View  Result Date: 02/03/2020 CLINICAL DATA:  Shortness of breath and cough.  Wheezing. EXAM: CHEST - 2 VIEW COMPARISON:  05/26/2015 FINDINGS: Heart size is normal. Mediastinal shadows are normal. No evidence of infiltrate, collapse or effusion. There may be mild central bronchial thickening. No significant bone finding. IMPRESSION: Possible mild central bronchial thickening. No consolidation or collapse. Electronically Signed   By: Nelson Chimes M.D.   On: 02/03/2020 14:58    Procedures Procedures (including critical care time)  Medications Ordered in UC Medications - No data to display  Initial Impression / Assessment and Plan / UC Course  I have reviewed the triage vital signs and the nursing notes.  Pertinent labs & imaging results that were available during my care of the patient were reviewed by me and considered in my medical decision making (see chart for details).   1-year male presenting for 1.5-week history of sinus congestion, productive cough and new onset of shortness of breath and wheezing over the past couple days.  All vital signs are stable.  Blood pressure slightly elevated 150/100.  Oxygen is 99%.  On exam, auscultation of chest  reveals diffuse rhonchi and wheezing throughout bilateral lungs.  He does not have any sinus tenderness.  There is postnasal drainage present.  Chest x-ray obtained today which is indicative of peribronchial thickening.  I suspect this is consistent with bronchitis.  Suspect viral illness.  Respiratory panel is negative.  Treating for viral bronchitis with albuterol to help with his wheezing and shortness of breath, prednisone, and Cheratussin.  Controlled substance database reviewed.  Advised to increase rest and fluid.  Patient declined work note.  Advised to follow-up with our department as needed for any new or worsening symptoms especially if he develops a fever or increased breathing difficulty.  Patient agreeable.  Final Clinical Impressions(s) / UC Diagnoses   Final diagnoses:  Acute bronchitis, unspecified organism  Cough  Shortness of breath   Discharge Instructions   None    ED Prescriptions    Medication Sig Dispense Auth. Provider   albuterol (VENTOLIN HFA) 108 (90 Base) MCG/ACT inhaler Inhale 1-2 puffs into the lungs every 6 (six) hours as needed for wheezing or shortness of breath. 1 g Laurene Footman B, PA-C   predniSONE (DELTASONE) 20 MG tablet Take 2 tablets (40 mg total) by mouth daily for 5 days. 10 tablet Laurene Footman B, PA-C   guaiFENesin-codeine (CHERATUSSIN AC) 100-10 MG/5ML syrup Take 10 mLs by mouth 4 (four) times daily as needed for up to 10 days for cough. 150 mL Danton Clap, PA-C     PDMP not reviewed this encounter.   Danton Clap, PA-C 02/03/20 1524

## 2020-11-25 ENCOUNTER — Ambulatory Visit
Admission: EM | Admit: 2020-11-25 | Discharge: 2020-11-25 | Disposition: A | Discharge status: Home / Self Care | Payer: PRIVATE HEALTH INSURANCE | Attending: Family Medicine | Admitting: Family Medicine

## 2020-11-25 ENCOUNTER — Other Ambulatory Visit: Payer: Self-pay

## 2020-11-25 DIAGNOSIS — R062 Wheezing: Secondary | ICD-10-CM | POA: Diagnosis not present

## 2020-11-25 DIAGNOSIS — J209 Acute bronchitis, unspecified: Secondary | ICD-10-CM | POA: Diagnosis not present

## 2020-11-25 MED ORDER — ALBUTEROL SULFATE HFA 108 (90 BASE) MCG/ACT IN AERS: 1.0000 | INHALATION_SPRAY | Freq: Four times a day (QID) | RESPIRATORY_TRACT | 3 refills | Status: DC | PRN

## 2020-11-25 MED ORDER — PREDNISONE 50 MG PO TABS: ORAL_TABLET | ORAL | 0 refills | Status: DC

## 2020-11-25 NOTE — Discharge Instructions (Addendum)
Medication as prescribed. ° °If you worsen, go to the ER. ° °Take care ° °Dr. Barbarann Kelly  °

## 2020-11-25 NOTE — ED Provider Notes (Signed)
[facility]    CSN: [id number] Arrival date & time: 11/25/20  0810      History   Chief Complaint Chief Complaint  Patient presents with   Cough   HPI  40 year old male presents with cough and SOB.  Patient reports he has been sick since Monday.  He reports cough, chest congestion, and shortness of breath.  He is a current smoker.  He states that this occurs approximately once a year.  He previously has had an inhaler but does not currently have one.  No fever.  His cough is slightly productive.  No medication interventions tried.  No relieving factors.   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    Patient Active Problem List   Diagnosis Date Noted   Umbilical hernia without obstruction and without gangrene 07/08/2019   Class 1 obesity due to excess calories without serious comorbidity with body mass index (BMI) of 34.0 to 34.9 in adult 07/08/2019   Carcinoma of tongue ([place]) 07/21/2014    Past Surgical History:  Procedure Laterality Date   DIRECT LARYNGOSCOPY N/A 08/13/2014   Procedure: DIRECT LARYNGOSCOPY;  Surgeon: [name], MD;  Location: [place];  Service: ENT;  Laterality: N/A;   EAR TUBE REMOVAL Right 1986   EXCISION NASAL MASS N/A 08/13/2014   Procedure: EXCISION NASAL MASS;  Surgeon: [name], MD;  Location: [place];  Service: ENT;  Laterality: N/A;   EXCISION OF TONGUE LESION Left 07/09/14   [place]   TONSILLECTOMY AND ADENOIDECTOMY  1986   TYMPANOSTOMY TUBE PLACEMENT Bilateral [place] Medications    Prior to Admission medications   Medication Sig Start Date End Date Taking? Authorizing Provider  albuterol (VENTOLIN HFA) 108 (90 Base) MCG/ACT inhaler Inhale 1-2 puffs into the lungs every 6 (six) hours as needed for wheezing or shortness of breath. 11/25/20  Yes Catalia Massett, Barnie Del, DO  predniSONE (DELTASONE) 50 MG tablet 1  tablet daily x 5 days 11/25/20  Yes Coral Spikes, DO    Family History Family History  Problem Relation Age of Onset   Cancer Mother     Social History Social History   Tobacco Use   Smoking status: Every Day    Packs/day: 1.00    Types: Cigarettes   Smokeless tobacco: Former    Quit date: 03/22/2014  Vaping Use   Vaping Use: Never used  Substance Use Topics   Alcohol use: Yes    Alcohol/week: 24.0 standard drinks    Types: 24 Cans of beer per week   Drug use: No     Allergies   Patient has no known allergies.   Review of Systems Review of Systems  Constitutional:  Negative for fever.  Respiratory:  Positive for cough and shortness of breath.    Physical Exam Triage Vital Signs ED Triage Vitals  Enc Vitals Group     BP 11/25/20 0833 125/90     Pulse Rate 11/25/20 0833 86     Resp 11/25/20 0833 18     Temp 11/25/20 0833 98.4 F (36.9 C)     Temp Source 11/25/20 0833 Oral     SpO2 11/25/20 0833 97 %     Weight 11/25/20 0832 265 lb (120.2 kg)     Height 11/25/20 0832 '6\' 2"'$  (1.88 m)     Head  Circumference --      Peak Flow --      Pain Score 11/25/20 0832 0     Pain Loc --      Pain Edu? --      Excl. in [place]? --    Updated Vital Signs BP 125/90 (BP Location: Left Arm)   Pulse 86   Temp 98.4 F (36.9 C) (Oral)   Resp 18   Ht '6\' 2"'$  (1.88 m)   Wt 120.2 kg   SpO2 97%   BMI 34.02 kg/m   Visual Acuity Right Eye Distance:   Left Eye Distance:   Bilateral Distance:    Right Eye Near:   Left Eye Near:    Bilateral Near:     Physical Exam Vitals and nursing note reviewed.  Constitutional:      General: He is not in acute distress.    Appearance: Normal appearance. He is not ill-appearing.  HENT:     Head: Normocephalic and atraumatic.  Eyes:     General:        Right eye: No discharge.        Left eye: No discharge.     Conjunctiva/sclera: Conjunctivae normal.  Cardiovascular:     Rate and Rhythm: Normal rate and regular rhythm.  Pulmonary:      Effort: Pulmonary effort is normal.     Breath sounds: Wheezing present.  Neurological:     Mental Status: He is alert.  Psychiatric:        Mood and Affect: Mood normal.        Behavior: Behavior normal.     UC Treatments / Results  Labs (all labs ordered are listed, but only abnormal results are displayed) Labs Reviewed - No data to display  EKG   Radiology No results found.  Procedures Procedures (including critical care time)  Medications Ordered in UC Medications - No data to display  Initial Impression / Assessment and Plan / UC Course  I have reviewed the triage vital signs and the nursing notes.  Pertinent labs & imaging results that were available during my care of the patient were reviewed by me and considered in my medical decision making (see chart for details).    40 year old male presents with acute bronchitis.  Diffuse wheezing on exam.  No indications for antibiotic therapy at this time.  Advised patient that he needs to stop smoking.  Albuterol and prednisone as prescribed.  Final Clinical Impressions(s) / UC Diagnoses   Final diagnoses:  Acute bronchitis, unspecified organism  Wheezing     Discharge Instructions      Medication as prescribed.  If you worsen, go to the ER.  Take care  Dr. Lacinda Axon      ED Prescriptions     Medication Sig Dispense Auth. Provider   albuterol (VENTOLIN HFA) 108 (90 Base) MCG/ACT inhaler Inhale 1-2 puffs into the lungs every 6 (six) hours as needed for wheezing or shortness of breath. 18 g Jadda Hunsucker G, DO   predniSONE (DELTASONE) 50 MG tablet 1 tablet daily x 5 days 5 tablet Thersa Salt G, DO      PDMP not reviewed this encounter.   Coral Spikes, DO 11/25/20 1013

## 2020-11-25 NOTE — ED Triage Notes (Signed)
Pt c/o cough and chest congestion since Monday. Pt thinks it may be bronchitis, states he gets sick about this time every year. Pt did recently have COVID, 10/11/20. Pt is asking for oral pred and an inhaler.

## 2021-05-19 ENCOUNTER — Encounter: Payer: Self-pay | Admitting: Emergency Medicine

## 2021-05-19 ENCOUNTER — Other Ambulatory Visit: Payer: Self-pay

## 2021-05-19 ENCOUNTER — Ambulatory Visit
Admission: EM | Admit: 2021-05-19 | Discharge: 2021-05-19 | Disposition: A | Discharge status: Home / Self Care | Payer: No Typology Code available for payment source | Attending: Physician Assistant | Admitting: Physician Assistant

## 2021-05-19 DIAGNOSIS — R051 Acute cough: Secondary | ICD-10-CM | POA: Diagnosis not present

## 2021-05-19 DIAGNOSIS — R0602 Shortness of breath: Secondary | ICD-10-CM

## 2021-05-19 DIAGNOSIS — R062 Wheezing: Secondary | ICD-10-CM | POA: Diagnosis not present

## 2021-05-19 MED ORDER — PREDNISONE 20 MG PO TABS: 40.0000 mg | ORAL_TABLET | Freq: Every day | ORAL | 0 refills | Status: AC

## 2021-05-19 MED ORDER — ALBUTEROL SULFATE HFA 108 (90 BASE) MCG/ACT IN AERS: 1.0000 | INHALATION_SPRAY | Freq: Four times a day (QID) | RESPIRATORY_TRACT | 1 refills | Status: DC | PRN

## 2021-05-19 NOTE — ED Triage Notes (Addendum)
Pt c/o nasal congestion, wheezing, shortness of breath, cough. Started about 2 days ago. Denies fever. Declines covid testing.  ?

## 2021-05-19 NOTE — ED Provider Notes (Signed)
?[place] ? ? ? ?CSN: [id number] ?Arrival date & time: 05/19/21  0801 ? ? ?  ? ?History   ?Chief Complaint ?Chief Complaint  ?Patient presents with  ? Nasal Congestion  ? Shortness of Breath  ? ? ?HPI ?Ross Chandler is a 41 y.o. male presenting for approximately 2-day history of nasal congestion, sinus pressure, wheezing, cough and shortness of breath.  Patient says this happens 1-2 times a year when the season changes.  He normally takes prednisone and albuterol.  I have seen patient for this same problem in the past.  He is a smoker.  He has never been diagnosed with any breathing conditions such as asthma or COPD.  He has been taking Tylenol Cold and sinus but says it has not helped much.  No fever.  No known COVID or flu exposure and does not want to be tested.  No other complaints. ? ?HPI ? ?Past Medical History:  ?Diagnosis Date  ? Hypertension   ? in past  ? Squamous cell carcinoma of lateral tongue ([facility]) left  ? ? ?Patient Active Problem List  ? Diagnosis Date Noted  ? Umbilical hernia without obstruction and without gangrene 07/08/2019  ? Class 1 obesity due to excess calories without serious comorbidity with body mass index (BMI) of 34.0 to 34.9 in adult 07/08/2019  ? Carcinoma of tongue ([place]) 07/21/2014  ? ? ?Past Surgical History:  ?Procedure Laterality Date  ? DIRECT LARYNGOSCOPY N/A 08/13/2014  ? Procedure: DIRECT LARYNGOSCOPY;  Surgeon: [name], MD;  Location: [place];  Service: ENT;  Laterality: N/A;  ? EAR TUBE REMOVAL Right 1986  ? EXCISION NASAL MASS N/A 08/13/2014  ? Procedure: EXCISION NASAL MASS;  Surgeon: [name], MD;  Location: [place];  Service: ENT;  Laterality: N/A;  ? EXCISION OF TONGUE LESION Left 07/09/14  ? [facility]  ? TONSILLECTOMY AND ADENOIDECTOMY  1986  ? TYMPANOSTOMY TUBE PLACEMENT Bilateral 1982, [place]  ? ? ? ? ? ?Home Medications   ? ?Prior to Admission medications   ?Medication Sig Start Date End Date Taking? Authorizing Provider   ?predniSONE (DELTASONE) 20 MG tablet Take 2 tablets (40 mg total) by mouth daily for 5 days. 05/19/21 05/24/21 Yes Laurene Footman B, PA-C  ?albuterol (VENTOLIN HFA) 108 (90 Base) MCG/ACT inhaler Inhale 1-2 puffs into the lungs every 6 (six) hours as needed for wheezing or shortness of breath. 05/19/21   Danton Clap, PA-C  ? ? ?Family History ?Family History  ?Problem Relation Age of Onset  ? Cancer Mother   ? ? ?Social History ?Social History  ? ?Tobacco Use  ? Smoking status: Every Day  ?  Packs/day: 1.00  ?  Types: Cigarettes  ? Smokeless tobacco: Former  ?  Quit date: 03/22/2014  ?Vaping Use  ? Vaping Use: Never used  ?Substance Use Topics  ? Alcohol use: Yes  ?  Alcohol/week: 24.0 standard drinks  ?  Types: 24 Cans of beer per week  ? Drug use: No  ? ? ? ?Allergies   ?Patient has no known allergies. ? ? ?Review of Systems ?Review of Systems  ?Constitutional:  Negative for fatigue and fever.  ?HENT:  Positive for congestion, rhinorrhea and sinus pressure. Negative for sinus pain and sore throat.   ?Respiratory:  Positive for cough, shortness of breath and wheezing.   ?Cardiovascular:  Negative for chest pain.  ?Gastrointestinal:  Negative for abdominal pain, diarrhea, nausea and vomiting.  ?Musculoskeletal:  Negative for  myalgias.  ?Neurological:  Negative for weakness, light-headedness and headaches.  ?Hematological:  Negative for adenopathy.  ? ? ?Physical Exam ?Triage Vital Signs ?ED Triage Vitals  ?Enc Vitals Group  ?   BP   ?   Pulse   ?   Resp   ?   Temp   ?   Temp src   ?   SpO2   ?   Weight   ?   Height   ?   Head Circumference   ?   Peak Flow   ?   Pain Score   ?   Pain Loc   ?   Pain Edu?   ?   Excl. in [place]?   ? ?No data found. ? ?Updated Vital Signs ?BP 127/90 (BP Location: Left Arm)   Pulse 73   Temp 97.9 ?F (36.6 ?C) (Oral)   Resp 18   Ht '6\' 2"'$  (1.88 m)   Wt 264 lb 15.9 oz (120.2 kg)   SpO2 97%   BMI 34.02 kg/m?  ?  ? ?Physical Exam ?Vitals and nursing note reviewed.  ?Constitutional:   ?    General: He is not in acute distress. ?   Appearance: He is well-developed. He is not ill-appearing.  ?HENT:  ?   Head: Normocephalic and atraumatic.  ?   Nose: Congestion present.  ?   Mouth/Throat:  ?   Mouth: Mucous membranes are moist.  ?   Pharynx: Oropharynx is clear.  ?Eyes:  ?   General: No scleral icterus. ?   Conjunctiva/sclera: Conjunctivae normal.  ?Cardiovascular:  ?   Rate and Rhythm: Normal rate and regular rhythm.  ?   Heart sounds: Normal heart sounds.  ?Pulmonary:  ?   Effort: Pulmonary effort is normal. No respiratory distress.  ?   Breath sounds: Wheezing (diffusely throughout all lung fields) present.  ?Musculoskeletal:  ?   Cervical back: Neck supple.  ?Skin: ?   General: Skin is warm and dry.  ?   Capillary Refill: Capillary refill takes less than 2 seconds.  ?Neurological:  ?   General: No focal deficit present.  ?   Mental Status: He is alert. Mental status is at baseline.  ?   Motor: No weakness.  ?   Coordination: Coordination normal.  ?   Gait: Gait normal.  ?Psychiatric:     ?   Mood and Affect: Mood normal.     ?   Behavior: Behavior normal.     ?   Thought Content: Thought content normal.  ? ? ? ?UC Treatments / Results  ?Labs ?(all labs ordered are listed, but only abnormal results are displayed) ?Labs Reviewed - No data to display ? ?EKG ? ? ?Radiology ?No results found. ? ?Procedures ?Procedures (including critical care time) ? ?Medications Ordered in UC ?Medications - No data to display ? ?Initial Impression / Assessment and Plan / UC Course  ?I have reviewed the triage vital signs and the nursing notes. ? ?Pertinent labs & imaging results that were available during my care of the patient were reviewed by me and considered in my medical decision making (see chart for details). ? ?41 year old male presenting for nasal congestion, sinus pressure, wheezing, cough and shortness of breath for the past 2 days.  Vitals all normal and stable patient is overall well-appearing.  On exam he  does have nasal congestion and diffuse wheezing throughout all lung fields.  No respiratory distress. ? ?I discussed with the patient that  he should follow-up with his PCP regarding these recurrent symptoms more than once a year.  It is quite possible that he could have allergy induced asthma or possibly even COPD.  He should have pulmonary function testing.  Until then, will give prednisone and albuterol since it helped in the past.  Also advised to start a daily antihistamine and Flonase.  Encouraged him to increase rest and fluids.  Reviewed returning for fever, worsening cough or increased breathing difficulty.  Reviewed going to ER for any severe acute worsening of his symptoms.  Work note given for today. ? ? ?Final Clinical Impressions(s) / UC Diagnoses  ? ?Final diagnoses:  ?Wheezing  ?Acute cough  ?Shortness of breath  ? ? ? ?Discharge Instructions   ? ?  ?-Start taking Claritin or Zyrtec in addition to the other medication you are taking.  Make sure it does not have antihistamine and not already. ?- I have sent prednisone and an inhaler to the pharmacy.  Hopefully this helps since it has before. ?- Make an appointment with your doctor.  This is happening more than once a year so you really should get tested to see if you have allergy related asthma/reactive airway or possibly COPD. ?-You need to be seen again if you develop a fever, worse breathing.  Go to ER for any severe acute changes. ? ? ? ? ?ED Prescriptions   ? ? Medication Sig Dispense Auth. Provider  ? albuterol (VENTOLIN HFA) 108 (90 Base) MCG/ACT inhaler Inhale 1-2 puffs into the lungs every 6 (six) hours as needed for wheezing or shortness of breath. 18 g Laurene Footman B, PA-C  ? predniSONE (DELTASONE) 20 MG tablet Take 2 tablets (40 mg total) by mouth daily for 5 days. 10 tablet Danton Clap, PA-C  ? ?  ? ?PDMP not reviewed this encounter. ?  ?Danton Clap, PA-C ?05/19/21 7628 ? ?

## 2021-05-19 NOTE — Discharge Instructions (Signed)
-  Start taking Claritin or Zyrtec in addition to the other medication you are taking.  Make sure it does not have antihistamine and not already. ?- I have sent prednisone and an inhaler to the pharmacy.  Hopefully this helps since it has before. ?- Make an appointment with your doctor.  This is happening more than once a year so you really should get tested to see if you have allergy related asthma/reactive airway or possibly COPD. ?-You need to be seen again if you develop a fever, worse breathing.  Go to ER for any severe acute changes. ?

## 2021-11-24 ENCOUNTER — Ambulatory Visit
Admission: EM | Admit: 2021-11-24 | Discharge: 2021-11-24 | Disposition: A | Discharge status: Home / Self Care | Payer: No Typology Code available for payment source | Attending: Emergency Medicine | Admitting: Emergency Medicine

## 2021-11-24 DIAGNOSIS — H66013 Acute suppurative otitis media with spontaneous rupture of ear drum, bilateral: Secondary | ICD-10-CM

## 2021-11-24 MED ORDER — AMOXICILLIN-POT CLAVULANATE 875-125 MG PO TABS: 1.0000 | ORAL_TABLET | Freq: Two times a day (BID) | ORAL | 0 refills | Status: AC

## 2021-11-24 NOTE — ED Triage Notes (Signed)
Patient presents to [facility] for Left ear pain and drainage-- started about 3 days ago.

## 2021-11-24 NOTE — ED Provider Notes (Signed)
[facility]    CSN: [id number] Arrival date & time: 11/24/21  1553      History   Chief Complaint Chief Complaint  Patient presents with   Otalgia    left   Ear Drainage    Left     HPI WARIS RODGER is a 41 y.o. male.   HPI  41 year old male here for evaluation of left ear complaint.  Patient is here with a complaint of pain and yellow drainage from the left ear for last 3 days.  This is not associated with any fever, runny nose, nasal congestion, or sore throat.  He also denies any changes in hearing.  He has not been swimming recently.  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    Patient Active Problem List   Diagnosis Date Noted   Umbilical hernia without obstruction and without gangrene 07/08/2019   Class 1 obesity due to excess calories without serious comorbidity with body mass index (BMI) of 34.0 to 34.9 in adult 07/08/2019   Carcinoma of tongue ([place]) 07/21/2014    Past Surgical History:  Procedure Laterality Date   DIRECT LARYNGOSCOPY N/A 08/13/2014   Procedure: DIRECT LARYNGOSCOPY;  Surgeon: [name], MD;  Location: [place];  Service: ENT;  Laterality: N/A;   EAR TUBE REMOVAL Right 1986   EXCISION NASAL MASS N/A 08/13/2014   Procedure: EXCISION NASAL MASS;  Surgeon: [name], MD;  Location: [place];  Service: ENT;  Laterality: N/A;   EXCISION OF TONGUE LESION Left 07/09/14   [place]   TONSILLECTOMY AND ADENOIDECTOMY  1986   TYMPANOSTOMY TUBE PLACEMENT Bilateral [place] Medications    Prior to Admission medications   Medication Sig Start Date End Date Taking? Authorizing Provider  amoxicillin-clavulanate (AUGMENTIN) 875-125 MG tablet Take 1 tablet by mouth every 12 (twelve) hours for 10 days. 11/24/21 12/04/21 Yes Margarette Canada, NP  albuterol (VENTOLIN HFA) 108 (90 Base) MCG/ACT inhaler Inhale 1-2 puffs into the  lungs every 6 (six) hours as needed for wheezing or shortness of breath. 05/19/21   Danton Clap, PA-C    Family History Family History  Problem Relation Age of Onset   Cancer Mother     Social History Social History   Tobacco Use   Smoking status: Every Day    Packs/day: 1.00    Types: Cigarettes   Smokeless tobacco: Former    Quit date: 03/22/2014  Vaping Use   Vaping Use: Never used  Substance Use Topics   Alcohol use: Yes    Alcohol/week: 24.0 standard drinks of alcohol    Types: 24 Cans of beer per week   Drug use: No     Allergies   Patient has no known allergies.   Review of Systems Review of Systems  Constitutional:  Negative for fever.  HENT:  Positive for ear discharge and ear pain. Negative for congestion, hearing loss, rhinorrhea and sore throat.   Respiratory:  Negative for cough.      Physical Exam Triage Vital Signs ED Triage Vitals  Enc Vitals Group     BP      Pulse      Resp      Temp      Temp src      SpO2      Weight      Height  Head Circumference      Peak Flow      Pain Score      Pain Loc      Pain Edu?      Excl. in [place]?    No data found.  Updated Vital Signs BP (!) 144/92 (BP Location: Left Arm)   Pulse 80   Temp 98.8 F (37.1 C) (Oral)   Ht '6\' 2"'$  (1.88 m)   Wt 264 lb 15.9 oz (120.2 kg)   SpO2 95%   BMI 34.02 kg/m   Visual Acuity Right Eye Distance:   Left Eye Distance:   Bilateral Distance:    Right Eye Near:   Left Eye Near:    Bilateral Near:     Physical Exam Vitals and nursing note reviewed.  Constitutional:      Appearance: Normal appearance. He is not ill-appearing.  HENT:     Head: Normocephalic and atraumatic.     Right Ear: Ear canal and external ear normal. There is no impacted cerumen.     Left Ear: External ear normal.  Skin:    General: Skin is warm and dry.     Capillary Refill: Capillary refill takes less than 2 seconds.  Neurological:     General: No focal deficit present.      Mental Status: He is alert and oriented to person, place, and time.  Psychiatric:        Mood and Affect: Mood normal.        Behavior: Behavior normal.        Thought Content: Thought content normal.        Judgment: Judgment normal.      UC Treatments / Results  Labs (all labs ordered are listed, but only abnormal results are displayed) Labs Reviewed - No data to display  EKG   Radiology No results found.  Procedures Procedures (including critical care time)  Medications Ordered in UC Medications - No data to display  Initial Impression / Assessment and Plan / UC Course  I have reviewed the triage vital signs and the nursing notes.  Pertinent labs & imaging results that were available during my care of the patient were reviewed by me and considered in my medical decision making (see chart for details).   Patient is a pleasant, nontoxic-appearing 41 year old male here for evaluation of 3 days worth of pain and drainage from the left ear that is not associate with any other upper respiratory symptoms.  Patient also denies any changes in hearing or fever.  On exam patient has frank yellow pus occupying the entirety of the left external auditory canal.  I am unable to visualize the tympanic membrane.  The right tympanic membrane is erythematous and injected with loss of landmarks.  The external auditory canal is clear.  Patient exam is consistent with otitis media bilaterally.  I believe that the left tympanic membrane has spontaneously ruptured.  I will place the patient on Augmentin twice daily for 10 days for treatment of the otitis media.  I have advised the patient to plug his left ear when he is in the shower to keep water from getting in the ear.  I have also instructed him to follow-up with his PCP in 3 to 4 weeks for reevaluation to make sure that the infection has resolved and the eardrum is healed or healing.   Final Clinical Impressions(s) / UC Diagnoses   Final  diagnoses:  Non-recurrent acute suppurative otitis media of both ears with  spontaneous rupture of tympanic membranes     Discharge Instructions      Take the Augmentin twice daily for 10 days with food for treatment of your ear infection.  Take an over-the-counter probiotic 1 hour after each dose of antibiotic to prevent diarrhea.  Use over-the-counter Tylenol and ibuprofen as needed for pain or fever.  Place a hot water bottle, or heating pad, underneath your pillowcase at night to help dilate up your ear and aid in pain relief as well as resolution of the infection.  Plug your left ear with a silicone ear plug when in the shower to prevent water from getting in your ear.  You will need to follow-up with your PCP in 3-4 weeks to ensure the eardrum has healed.  Return for reevaluation for any new or worsening symptoms.      ED Prescriptions     Medication Sig Dispense Auth. Provider   amoxicillin-clavulanate (AUGMENTIN) 875-125 MG tablet Take 1 tablet by mouth every 12 (twelve) hours for 10 days. 20 tablet Margarette Canada, NP      PDMP not reviewed this encounter.   Margarette Canada, NP 11/24/21 1643

## 2021-11-24 NOTE — Discharge Instructions (Signed)
Take the Augmentin twice daily for 10 days with food for treatment of your ear infection.  Take an over-the-counter probiotic 1 hour after each dose of antibiotic to prevent diarrhea.  Use over-the-counter Tylenol and ibuprofen as needed for pain or fever.  Place a hot water bottle, or heating pad, underneath your pillowcase at night to help dilate up your ear and aid in pain relief as well as resolution of the infection.  Plug your left ear with a silicone ear plug when in the shower to prevent water from getting in your ear.  You will need to follow-up with your PCP in 3-4 weeks to ensure the eardrum has healed.  Return for reevaluation for any new or worsening symptoms.

## 2021-12-07 ENCOUNTER — Ambulatory Visit (INDEPENDENT_AMBULATORY_CARE_PROVIDER_SITE_OTHER): Payer: No Typology Code available for payment source

## 2021-12-07 ENCOUNTER — Ambulatory Visit
Admission: EM | Admit: 2021-12-07 | Discharge: 2021-12-07 | Disposition: A | Discharge status: Home / Self Care | Payer: No Typology Code available for payment source | Attending: Emergency Medicine | Admitting: Emergency Medicine

## 2021-12-07 DIAGNOSIS — J40 Bronchitis, not specified as acute or chronic: Secondary | ICD-10-CM | POA: Diagnosis present

## 2021-12-07 DIAGNOSIS — R0602 Shortness of breath: Secondary | ICD-10-CM

## 2021-12-07 DIAGNOSIS — R059 Cough, unspecified: Secondary | ICD-10-CM

## 2021-12-07 DIAGNOSIS — Z20822 Contact with and (suspected) exposure to covid-19: Secondary | ICD-10-CM | POA: Insufficient documentation

## 2021-12-07 LAB — RESP PANEL BY RT-PCR (FLU A&B, COVID) ARPGX2
Influenza A by PCR: NEGATIVE
Influenza B by PCR: NEGATIVE
SARS Coronavirus 2 by RT PCR: NEGATIVE

## 2021-12-07 MED ORDER — AEROCHAMBER MV MISC: 2 refills | Status: DC

## 2021-12-07 MED ORDER — IPRATROPIUM BROMIDE 0.06 % NA SOLN: 2.0000 | Freq: Four times a day (QID) | NASAL | 12 refills | Status: DC

## 2021-12-07 MED ORDER — BENZONATATE 100 MG PO CAPS: 200.0000 mg | ORAL_CAPSULE | Freq: Three times a day (TID) | ORAL | 0 refills | Status: DC

## 2021-12-07 MED ORDER — PROMETHAZINE-DM 6.25-15 MG/5ML PO SYRP: 5.0000 mL | ORAL_SOLUTION | Freq: Four times a day (QID) | ORAL | 0 refills | Status: DC | PRN

## 2021-12-07 MED ORDER — PREDNISONE 20 MG PO TABS: 60.0000 mg | ORAL_TABLET | Freq: Every day | ORAL | 0 refills | Status: AC

## 2021-12-07 MED ORDER — ALBUTEROL SULFATE HFA 108 (90 BASE) MCG/ACT IN AERS: 2.0000 | INHALATION_SPRAY | RESPIRATORY_TRACT | 0 refills | Status: DC | PRN

## 2021-12-07 MED ORDER — ALBUTEROL SULFATE (2.5 MG/3ML) 0.083% IN NEBU
2.5000 mg | INHALATION_SOLUTION | Freq: Once | RESPIRATORY_TRACT | Status: AC
  Administered 2021-12-07: 2.5 mg via RESPIRATORY_TRACT

## 2021-12-07 NOTE — Discharge Instructions (Addendum)
Use the albuterol inhaler/nebulizer every 4-6 hours as needed for shortness of breath, wheezing, and cough.  Take the prednisone according to the package instructions to help with pulmonary inflammation.  Use the Atrovent nasal spray, 2 squirts up each nostril for 6 hours, as needed for nasal congestion and runny nose.  Use the Tessalon Perles every 8 hours for your cough.  Taken with a small sip of water.  They may give you some numbness to the base of your tongue or metallic taste in her mouth, this is normal.  They are designed to calm down the cough reflex.  Use the Promethazine DM cough syrup at bedtime as will make you drowsy.  You may take 1 teaspoon (5 mL) every 6 hours.  Return for reevaluation for new or worsening symptoms.

## 2021-12-07 NOTE — ED Triage Notes (Signed)
Patient presents to UC for body aches, congestion, cough -- started about 3 days ago.

## 2021-12-07 NOTE — ED Provider Notes (Signed)
[facility]    CSN: [id number] Arrival date & time: 12/07/21  6834      History   Chief Complaint Chief Complaint  Patient presents with   Cough   Nasal Congestion   Generalized Body Aches    HPI Ross Chandler is a 41 y.o. male.   HPI  26 old male here for evaluation of respiratory complaints.  Patient reports that for last 3 days he has been experiencing nasal congestion with clear nasal discharge, body aches, fever with a Tmax of 100.7, productive cough for yellow sputum, shortness breath, and wheezing.  He is currently on Augmentin for otitis media.  He denies any GI symptoms.  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    Patient Active Problem List   Diagnosis Date Noted   Umbilical hernia without obstruction and without gangrene 07/08/2019   Class 1 obesity due to excess calories without serious comorbidity with body mass index (BMI) of 34.0 to 34.9 in adult 07/08/2019   Carcinoma of tongue ([place]) 07/21/2014    Past Surgical History:  Procedure Laterality Date   DIRECT LARYNGOSCOPY N/A 08/13/2014   Procedure: DIRECT LARYNGOSCOPY;  Surgeon: [name], MD;  Location: [place];  Service: ENT;  Laterality: N/A;   EAR TUBE REMOVAL Right 1986   EXCISION NASAL MASS N/A 08/13/2014   Procedure: EXCISION NASAL MASS;  Surgeon: [name], MD;  Location: [place];  Service: ENT;  Laterality: N/A;   EXCISION OF TONGUE LESION Left 07/09/14   [place]   TONSILLECTOMY AND ADENOIDECTOMY  1986   TYMPANOSTOMY TUBE PLACEMENT Bilateral [place] Medications    Prior to Admission medications   Medication Sig Start Date End Date Taking? Authorizing Provider  albuterol (VENTOLIN HFA) 108 (90 Base) MCG/ACT inhaler Inhale 2 puffs into the lungs every 4 (four) hours as needed. 12/07/21  Yes Margarette Canada, NP  benzonatate (TESSALON) 100 MG capsule Take  2 capsules (200 mg total) by mouth every 8 (eight) hours. 12/07/21  Yes Margarette Canada, NP  ipratropium (ATROVENT) 0.06 % nasal spray Place 2 sprays into both nostrils 4 (four) times daily. 12/07/21  Yes Margarette Canada, NP  predniSONE (DELTASONE) 20 MG tablet Take 3 tablets (60 mg total) by mouth daily with breakfast for 5 days. 3 tablets daily for 5 days. 12/07/21 12/12/21 Yes Margarette Canada, NP  promethazine-dextromethorphan (PROMETHAZINE-DM) 6.25-15 MG/5ML syrup Take 5 mLs by mouth 4 (four) times daily as needed. 12/07/21  Yes Margarette Canada, NP  Spacer/Aero-Holding Josiah Lobo (AEROCHAMBER MV) inhaler Use as instructed 12/07/21  Yes Margarette Canada, NP    Family History Family History  Problem Relation Age of Onset   Cancer Mother     Social History Social History   Tobacco Use   Smoking status: Every Day    Packs/day: 1.00    Types: Cigarettes   Smokeless tobacco: Former    Quit date: 03/22/2014  Vaping Use   Vaping Use: Never used  Substance Use Topics   Alcohol use: Yes    Alcohol/week: 24.0 standard drinks of alcohol    Types: 24 Cans of beer per week   Drug use: No     Allergies   Patient has no known allergies.   Review of Systems Review of Systems  Constitutional:  Positive for fever.  HENT:  Positive for congestion and rhinorrhea. Negative  for sore throat.   Respiratory:  Positive for cough, shortness of breath and wheezing.   Gastrointestinal:  Negative for diarrhea, nausea and vomiting.  Musculoskeletal:  Positive for arthralgias and myalgias.  Skin:  Negative for rash.  Hematological: Negative.   Psychiatric/Behavioral: Negative.       Physical Exam Triage Vital Signs ED Triage Vitals  Enc Vitals Group     BP 12/07/21 1034 (!) 152/79     Pulse Rate 12/07/21 1034 (!) 102     Resp --      Temp 12/07/21 1034 98.6 F (37 C)     Temp Source 12/07/21 1034 Oral     SpO2 12/07/21 1034 96 %     Weight 12/07/21 1033 265 lb (120.2 kg)     Height 12/07/21 1033 '6\' 2"'$  (1.88  m)     Head Circumference --      Peak Flow --      Pain Score 12/07/21 1032 7     Pain Loc --      Pain Edu? --      Excl. in [place]? --    No data found.  Updated Vital Signs BP (!) 152/79 (BP Location: Left Arm)   Pulse (!) 102   Temp 98.6 F (37 C) (Oral)   Ht '6\' 2"'$  (1.88 m)   Wt 265 lb (120.2 kg)   SpO2 96%   BMI 34.02 kg/m   Visual Acuity Right Eye Distance:   Left Eye Distance:   Bilateral Distance:    Right Eye Near:   Left Eye Near:    Bilateral Near:     Physical Exam Vitals and nursing note reviewed.  Constitutional:      Appearance: Normal appearance. He is ill-appearing.  HENT:     Head: Normocephalic and atraumatic.     Right Ear: Tympanic membrane, ear canal and external ear normal. There is no impacted cerumen.     Left Ear: Tympanic membrane, ear canal and external ear normal. There is no impacted cerumen.     Nose: Congestion and rhinorrhea present.     Mouth/Throat:     Mouth: Mucous membranes are moist.     Pharynx: Oropharynx is clear. Posterior oropharyngeal erythema present. No oropharyngeal exudate.  Cardiovascular:     Rate and Rhythm: Normal rate and regular rhythm.     Pulses: Normal pulses.     Heart sounds: Normal heart sounds. No murmur heard.    No friction rub. No gallop.  Pulmonary:     Effort: Pulmonary effort is normal.     Breath sounds: Wheezing present. No rhonchi or rales.  Musculoskeletal:     Cervical back: Normal range of motion and neck supple.  Lymphadenopathy:     Cervical: No cervical adenopathy.  Skin:    General: Skin is warm and dry.     Capillary Refill: Capillary refill takes less than 2 seconds.     Findings: No erythema or rash.  Neurological:     General: No focal deficit present.     Mental Status: He is alert and oriented to person, place, and time.  Psychiatric:        Mood and Affect: Mood normal.        Behavior: Behavior normal.        Thought Content: Thought content normal.        Judgment:  Judgment normal.      UC Treatments / Results  Labs (all labs ordered are listed, but only  abnormal results are displayed) Labs Reviewed  RESP PANEL BY RT-PCR (FLU A&B, COVID) [id number]    EKG   Radiology DG Chest 2 View  Result Date: 12/07/2021 CLINICAL DATA:  Body aches, congestion, cough, shortness of breath EXAM: CHEST - 2 VIEW COMPARISON:  02/03/2020 FINDINGS: Normal heart size, mediastinal contours, and pulmonary vascularity. Mild bronchitic changes, increased. No pulmonary infiltrate, pleural effusion, or pneumothorax. No acute osseous findings. IMPRESSION: Bronchitic changes without infiltrate. Electronically Signed   By: Lavonia Dana M.D.   On: 12/07/2021 11:35    Procedures Procedures (including critical care time)  Medications Ordered in UC Medications  albuterol (PROVENTIL) (2.5 MG/3ML) 0.083% nebulizer solution 2.5 mg (has no administration in time range)    Initial Impression / Assessment and Plan / UC Course  I have reviewed the triage vital signs and the nursing notes.  Pertinent labs & imaging results that were available during my care of the patient were reviewed by me and considered in my medical decision making (see chart for details).   Patient is a pleasant, though ill-appearing, 37 old male here for evaluation of 3 days worth of respiratory symptoms.  He is able speak in full sentences but talking does trigger cough.  On exam patient has pearly-gray tympanic membranes bilaterally with normal light reflex and clear external auditory canals.  His ear infection appears to be resolving or near resolved.  Nasal mucosa is erythematous and edematous with clear discharge in both nares.  Oropharyngeal exam reveals mild posterior oropharyngeal erythema and injection with clear postnasal drip.  No anterior cervical lymphadenopathy on exam.  Cardiopulmonary exam reveals decreased lung sounds diffusely with expiratory wheezes.  Patient is not experiencing dyspnea or tachypnea.   I will order a respiratory triplex panel to look for the presence of flu or COVID.  I muscular order chest x-ray to look for the presence of pneumonia.  If patient's COVID is negative I will administer a breathing treatment in clinic and discharge him home on albuterol Hailer help the shortness of breath and wheezing.  Respiratory panel is negative for COVID and influenza.  Radiology impression states that there are bronchitic changes without infiltrate.  I will order an albuterol nebulizer treatment here in clinic prior to patient's discharge.  I will discharge him home with an albuterol inhaler and spacer that he can use 2 puffs every 4-6 hours as needed for wheezing and shortness of breath.  Also prescribed prednisone to help with the bronchitic changes along with Tessalon Perles and Promethazine DM cough syrup.  I will have him finish his Augmentin that he is currently taking for his ear infection.   Final Clinical Impressions(s) / UC Diagnoses   Final diagnoses:  Bronchitis     Discharge Instructions      Use the albuterol inhaler/nebulizer every 4-6 hours as needed for shortness of breath, wheezing, and cough.  Take the prednisone according to the package instructions to help with pulmonary inflammation.  Use the Atrovent nasal spray, 2 squirts up each nostril for 6 hours, as needed for nasal congestion and runny nose.  Use the Tessalon Perles every 8 hours for your cough.  Taken with a small sip of water.  They may give you some numbness to the base of your tongue or metallic taste in her mouth, this is normal.  They are designed to calm down the cough reflex.  Use the Promethazine DM cough syrup at bedtime as will make you drowsy.  You may take 1 teaspoon (5  mL) every 6 hours.  Return for reevaluation for new or worsening symptoms.      ED Prescriptions     Medication Sig Dispense Auth. Provider   albuterol (VENTOLIN HFA) 108 (90 Base) MCG/ACT inhaler Inhale 2 puffs into  the lungs every 4 (four) hours as needed. 18 g Margarette Canada, NP   Spacer/Aero-Holding Chambers (AEROCHAMBER MV) inhaler Use as instructed 1 each Margarette Canada, NP   benzonatate (TESSALON) 100 MG capsule Take 2 capsules (200 mg total) by mouth every 8 (eight) hours. 21 capsule Margarette Canada, NP   ipratropium (ATROVENT) 0.06 % nasal spray Place 2 sprays into both nostrils 4 (four) times daily. 15 mL Margarette Canada, NP   predniSONE (DELTASONE) 20 MG tablet Take 3 tablets (60 mg total) by mouth daily with breakfast for 5 days. 3 tablets daily for 5 days. 15 tablet Margarette Canada, NP   promethazine-dextromethorphan (PROMETHAZINE-DM) 6.25-15 MG/5ML syrup Take 5 mLs by mouth 4 (four) times daily as needed. 118 mL Margarette Canada, NP      PDMP not reviewed this encounter.   Margarette Canada, NP 12/07/21 1145

## 2022-06-23 ENCOUNTER — Other Ambulatory Visit: Payer: Self-pay

## 2022-06-23 ENCOUNTER — Emergency Department
Admission: EM | Admit: 2022-06-23 | Discharge: 2022-06-23 | Disposition: A | Discharge status: Home / Self Care | Payer: PRIVATE HEALTH INSURANCE | Attending: Emergency Medicine | Admitting: Emergency Medicine

## 2022-06-23 ENCOUNTER — Emergency Department: Payer: PRIVATE HEALTH INSURANCE

## 2022-06-23 DIAGNOSIS — K429 Umbilical hernia without obstruction or gangrene: Secondary | ICD-10-CM | POA: Diagnosis present

## 2022-06-23 LAB — COMPREHENSIVE METABOLIC PANEL
ALT: 34 U/L (ref 0–44)
AST: 24 U/L (ref 15–41)
Albumin: 4.4 g/dL (ref 3.5–5.0)
Alkaline Phosphatase: 46 U/L (ref 38–126)
Anion gap: 11 (ref 5–15)
BUN: 13 mg/dL (ref 6–20)
CO2: 24 mmol/L (ref 22–32)
Calcium: 9.1 mg/dL (ref 8.9–10.3)
Chloride: 102 mmol/L (ref 98–111)
Creatinine, Ser: 0.89 mg/dL (ref 0.61–1.24)
GFR, Estimated: >60 mL/min (ref >60)
Glucose, Bld: 103 mg/dL — ABNORMAL HIGH (ref 70–99)
Potassium: 3.3 mmol/L — ABNORMAL LOW (ref 3.5–5.1)
Sodium: 137 mmol/L (ref 135–145)
Total Bilirubin: 0.7 mg/dL (ref 0.3–1.2)
Total Protein: 7.6 g/dL (ref 6.5–8.1)

## 2022-06-23 LAB — CBC
HCT: 46.4 % (ref 39.0–52.0)
Hemoglobin: 16 g/dL (ref 13.0–17.0)
MCH: 31.3 pg (ref 26.0–34.0)
MCHC: 34.5 g/dL (ref 30.0–36.0)
MCV: 90.8 fL (ref 80.0–100.0)
Platelets: 253 10*3/uL (ref 150–400)
RBC: 5.11 MIL/uL (ref 4.22–5.81)
RDW: 12.5 % (ref 11.5–15.5)
WBC: 6.8 10*3/uL (ref 4.0–10.5)
nRBC: 0 % (ref 0.0–0.2)

## 2022-06-23 LAB — LIPASE, BLOOD: Lipase: 43 U/L (ref 11–51)

## 2022-06-23 MED ORDER — MORPHINE SULFATE (PF) 4 MG/ML IV SOLN
4.0000 mg | Freq: Once | INTRAVENOUS | Status: DC
  Filled 2022-06-23: qty 1

## 2022-06-23 MED ORDER — IOHEXOL 350 MG/ML SOLN
100.0000 mL | Freq: Once | INTRAVENOUS | Status: AC | PRN
  Administered 2022-06-23: 100 mL via INTRAVENOUS

## 2022-06-23 NOTE — ED Provider Notes (Signed)
[facility] Provider Note    Event Date/Time   First MD Initiated Contact with Patient 06/23/22 2029     (approximate)   History   Hernia   HPI  Ross Chandler is a 42 y.o. male who presents to the urgency department today because of concerns for umbilical hernia.  Patient states he has had it for years and typically is able to put it back in however today after getting home from work he noticed it.  He rates that its pain at 5 out of 10.  He did try to put the hernia back and with all of his usual strategies and it was not successful.  He denies any nausea or vomiting.  Has not had a bowel movement today and does not recall passing gas today.     Physical Exam   Triage Vital Signs: ED Triage Vitals [06/23/22 2016]  Enc Vitals Group     BP (!) 144/103     Pulse Rate 82     Resp 18     Temp 98.2 F (36.8 C)     Temp Source Oral     SpO2 99 %     Weight 260 lb (117.9 kg)     Height  (1.88 m)     Head Circumference      Peak Flow      Pain Score 5     Pain Loc      Pain Edu?      Excl. in [place]?     Most recent vital signs: Vitals:   06/23/22 2016  BP: (!) 144/103  Pulse: 82  Resp: 18  Temp: 98.2 F (36.8 C)  SpO2: 99%   General: Awake, alert, oriented. CV:  Good peripheral perfusion. Regular rate and rhythm. Resp:  Normal effort. Lungs clear. Abd:  No distention. Umbilical hernia.    ED Results / Procedures / Treatments   Labs (all labs ordered are listed, but only abnormal results are displayed) Labs Reviewed  COMPREHENSIVE METABOLIC PANEL - Abnormal; Notable for the following components:      Result Value   Potassium 3.3 (*)    Glucose, Bld 103 (*)    All other components within normal limits  LIPASE, BLOOD  CBC  URINALYSIS, ROUTINE W REFLEX MICROSCOPIC     EKG  None   RADIOLOGY I independently interpreted and visualized the CT abd/pel. My interpretation: fat containing hernia Radiology interpretation:   IMPRESSION:  1. Fat containing umbilical hernia has increased in size since  PET/CT 03/11/2015. There is associated fat stranding in the anterior  peritoneum and within the herniated fat which can be seen with  strangulation.      PROCEDURES:  Critical Care performed: No    MEDICATIONS ORDERED IN ED: Medications - No data to display   IMPRESSION / MDM / ASSESSMENT AND PLAN / ED COURSE  I reviewed the triage vital signs and the nursing notes.                              Differential diagnosis includes, but is not limited to, fat containing hernia, intestine containing hernia, strangulation, incarceration.  Patient's presentation is most consistent with acute presentation with potential threat to life or bodily function.   Patient presented to the emergency department today because of concerns for umbilical hernia.  On exam patient did have obvious umbilical hernia.  I did try to reduce  it with constant pressure number minutes without any success.  Blood work without concerning leukocytosis.  Did obtain a CT scan which did show a fat-containing hernia.  There was concern for possible strangulation with some fat stranding.  However after CT scan patient did get up to use the bathroom and the hernia reduced on its own.  Will give patient surgery clinic information follow-up.     FINAL CLINICAL IMPRESSION(S) / ED DIAGNOSES   Final diagnoses:  Umbilical hernia without obstruction and without gangrene     Note:  This document was prepared using Dragon voice recognition software and may include unintentional dictation errors.    Phineas Semen, MD 06/23/22 [phone]

## 2022-06-23 NOTE — ED Triage Notes (Signed)
Umbilical hernia x4 years. Reports was mowing the grass and it popped out. Painful and tender to palpation. Pt ambulatory to triage. Alert and oriented following commands. Breathing unlabored speaking in full sentences.

## 2022-06-23 NOTE — Discharge Instructions (Signed)
Please seek medical attention for any high fevers, chest pain, shortness of breath, change in behavior, persistent vomiting, bloody stool or any other new or concerning symptoms.  

## 2022-07-06 NOTE — H&P (View-Only) (Signed)
Patient ID: Ross Chandler, male   DOB: 03/05/1981, 41 y.o.   MRN: [id number]  Chief Complaint: Umbilical hernia  History of Present Illness Ross Chandler is a 41 y.o. male with a known umbilical hernia for 4 to 5 years.  He works as maintenance/handyman.  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  Recently had a severe episode of pain which in which he could not reduce the hernia, presented to the ED where he received a degree of reassurance.  He currently has no pain, denies any nausea, vomiting history.  Has no history of fevers or chills.  Seems the umbilical presents has grown larger.  Clearly worse upon coughing.  And he remains a smoker today.  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      Past Surgical History:  Procedure Laterality Date   DIRECT LARYNGOSCOPY N/A 08/13/2014   Procedure: DIRECT LARYNGOSCOPY;  Surgeon: Paul Juengel, MD;  Location: [facility];  Service: ENT;  Laterality: N/A;   EAR TUBE REMOVAL Right 1986   EXCISION NASAL MASS N/A 08/13/2014   Procedure: EXCISION NASAL MASS;  Surgeon: Paul Juengel, MD;  Location: [facility];  Service: ENT;  Laterality: N/A;   EXCISION OF TONGUE LESION Left 07/09/14   [facility]   TONSILLECTOMY AND ADENOIDECTOMY  1986   TYMPANOSTOMY TUBE PLACEMENT Bilateral 1982, 1986, 1987    No Known Allergies  Current Outpatient Medications  Medication Sig Dispense Refill   albuterol (VENTOLIN HFA) 108 (90 Base) MCG/ACT inhaler Inhale 2 puffs into the lungs every 4 (four) hours as needed. 18 g 0   Spacer/Aero-Holding Chambers (AEROCHAMBER MV) inhaler Use as instructed 1 each 2   No current facility-administered medications for this visit.    Family History Family History  Problem Relation Age of Onset   Cancer Mother       Social History Social History   Tobacco Use    Smoking status: Every Day    Packs/day: 1    Types: Cigarettes   Smokeless tobacco: Former    Quit date: 03/22/2014  Vaping Use   Vaping Use: Never used  Substance Use Topics   Alcohol use: Yes    Alcohol/week: 24.0 standard drinks of alcohol    Types: 24 Cans of beer per week   Drug use: No        Review of Systems  Constitutional: Negative.   HENT: Negative.    Eyes: Negative.   Respiratory:  Positive for cough and wheezing.   Cardiovascular: Negative.   Gastrointestinal: Negative.   Genitourinary: Negative.   Skin: Negative.   Neurological: Negative.   Psychiatric/Behavioral:  Positive for substance abuse (Tobacco).      Physical Exam Blood pressure (!) 142/91, pulse 74, temperature 98 F (36.7 C), temperature source Oral, height 6' 2" (1.88 m), weight 261 lb (118.4 kg), SpO2 95 %. Last Weight  Most recent update: 07/11/2022  8:35 AM    Weight  118.4 kg (261 lb)             CONSTITUTIONAL: Well developed, and nourished, appropriately responsive and aware without distress.   EYES: Sclera non-icteric.   EARS, NOSE, MOUTH AND THROAT:  The oropharynx is clear. Oral mucosa is pink and moist.    Hearing is intact to voice.  NECK: Trachea is midline, and there   is no jugular venous distension.  LYMPH NODES:  Lymph nodes in the neck are not appreciated. RESPIRATORY:  Lungs are clear, and breath sounds are equal bilaterally.  Normal respiratory effort without pathologic use of accessory muscles. CARDIOVASCULAR: Heart is regular in rate and rhythm.   Well perfused.  GI: The abdomen is soft, nontender, and nondistended.  There is reducible umbilical hernia, though slightly difficult to appreciate the fascial defect is close to 3 cm.  There were no other palpable masses.  I did not appreciate hepatosplenomegaly.  MUSCULOSKELETAL:  Symmetrical muscle tone appreciated in all four extremities.    SKIN: Skin turgor is normal. No pathologic skin lesions appreciated.  NEUROLOGIC:   Motor and sensation appear grossly normal.  Cranial nerves are grossly without defect. PSYCH:  Alert and oriented to person, place and time. Affect is appropriate for situation.  Data Reviewed I have personally reviewed what is currently available of the patient's imaging, recent labs and medical records.   Labs:     Latest Ref Rng & Units 06/23/2022    8:19 PM 10/25/2017   10:30 PM 02/16/2015    9:42 AM  CBC  WBC 4.0 - 10.5 K/uL 6.8  5.2  3.7   Hemoglobin 13.0 - 17.0 g/dL 16.0  16.7  15.3   Hematocrit 39.0 - 52.0 % 46.4  46.5  44.3   Platelets 150 - 400 K/uL 253  262  213       Latest Ref Rng & Units 06/23/2022    8:19 PM 10/25/2017   10:30 PM 02/16/2015    9:42 AM  CMP  Glucose 70 - 99 mg/dL 103  106  106   BUN 6 - 20 mg/dL 13  6  15   Creatinine 0.61 - 1.24 mg/dL 0.89  0.85  0.98   Sodium 135 - 145 mmol/L 137  136  131   Potassium 3.5 - 5.1 mmol/L 3.3  4.2  4.0   Chloride 98 - 111 mmol/L 102  104  101   CO2 22 - 32 mmol/L 24  26  23   Calcium 8.9 - 10.3 mg/dL 9.1  8.9  8.5   Total Protein 6.5 - 8.1 g/dL 7.6  7.9  6.9   Total Bilirubin 0.3 - 1.2 mg/dL 0.7  0.9  0.8   Alkaline Phos 38 - 126 U/L 46  44  44   AST 15 - 41 U/L 24  21  15   ALT 0 - 44 U/L 34  32  20       Imaging: Radiological images reviewed:  CLINICAL DATA:  Umbilical hernia pain. Patient was mowing the grass and popped out. Painful and tender to palpation.   EXAM: CT ABDOMEN AND PELVIS WITH CONTRAST   TECHNIQUE: Multidetector CT imaging of the abdomen and pelvis was performed using the standard protocol following bolus administration of intravenous contrast.   RADIATION DOSE REDUCTION: This exam was performed according to the departmental dose-optimization program which includes automated exposure control, adjustment of the mA and/or kV according to patient size and/or use of iterative reconstruction technique.   CONTRAST:  100mL OMNIPAQUE IOHEXOL 350 MG/ML SOLN   COMPARISON:  PET/CT 03/11/2015    FINDINGS: Lower chest: No acute abnormality.   Hepatobiliary: No focal liver abnormality is seen. No gallstones, gallbladder wall thickening, or biliary dilatation.   Pancreas: Unremarkable.   Spleen: Unremarkable.   Adrenals/Urinary Tract: Normal adrenal glands. No urinary calculi or hydronephrosis. Bladder is nondistended.   Stomach/Bowel: No   evidence of obstruction. No bowel wall thickening. Colonic diverticulosis without diverticulitis. Normal appendix. Stomach is within normal limits.   Vascular/Lymphatic: No significant vascular findings are present. No enlarged abdominal or pelvic lymph nodes.   Reproductive: Unremarkable.   Other: Fat containing umbilical hernia has increased in size since PET/CT 03/11/2015. The hernia defect measures approximately 13 mm. There is associated fat stranding in the anterior peritoneum and within the herniated fat. No free intraperitoneal fluid or air.   Musculoskeletal: No acute osseous abnormality. Chronic bilateral L5 pars defects.   IMPRESSION: 1. Fat containing umbilical hernia has increased in size since PET/CT 03/11/2015. There is associated fat stranding in the anterior peritoneum and within the herniated fat which can be seen with strangulation.     Electronically Signed   By: Tyler  Stutzman M.D.   On: 06/23/2022 22:41   Within last 24 hrs: No results found.  Assessment     Patient Active Problem List   Diagnosis Date Noted   Current smoker 07/11/2022   Umbilical hernia without obstruction and without gangrene 07/08/2019   Class 1 obesity due to excess calories without serious comorbidity with body mass index (BMI) of 34.0 to 34.9 in adult 07/08/2019   Carcinoma of tongue (HCC) 07/21/2014    Plan    We discussed various options at approaching hernia repair.  Felt prudent that we reinforce this relatively small defect, considering overall his BMI and his history of smoking.  Robotic repair of anterior abdominal  hernia proximately 3 cm fascial defect size, intention of utilizing mesh reinforcement.  We discussed the benefits of smoking cessation, and enabling more time for improving his lung function prior to surgery.  We also discussed the benefit of weight loss preoperatively.  He desires to pursue repair more urgently, considering his work and not wanting to miss any more work than necessary.  I discussed possibility of incarceration, strangulation, enlargement in size over time, and the need for emergency surgery in the face of these.  Also reviewed the techniques of reduction should incarceration occur, and when unsuccessful to present to the ED.  Also discussed that surgery risks include recurrence which can be up to 30% in the case of complex hernias, use of prosthetic materials (mesh) and the increased risk of infection and the possible need for re-operation and removal of mesh, possibility of post-op SBO or ileus, and the risks of general anesthetic including heart attack, stroke, sudden death or some reaction to anesthetic medications. The patient, and those present, appear to understand the risks, any and all questions were answered to the patient's satisfaction.  No guarantees were ever expressed or implied.   Face-to-face time spent with the patient and accompanying care providers(if present) was 30 minutes, with more than 50% of the time spent counseling, educating, and coordinating care of the patient.    These notes generated with voice recognition software. I apologize for typographical errors.  Jasen Hartstein M.D., FACS 07/11/2022, 9:18 AM     

## 2022-07-06 NOTE — Progress Notes (Signed)
Patient ID: Ross Chandler, male   DOB: [birth year], 42 y.o.   MRN: [id number]  Chief Complaint: Umbilical hernia  History of Present Illness Ross Chandler is a 42 y.o. male with a known umbilical hernia for 4 to 5 years.  He works as Sports coach.  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  Recently had a severe episode of pain which in which he could not reduce the hernia, presented to the ED where he received a degree of reassurance.  He currently has no pain, denies any nausea, vomiting history.  Has no history of fevers or chills.  Seems the umbilical presents has grown larger.  Clearly worse upon coughing.  And he remains a smoker today.  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      Past Surgical History:  Procedure Laterality Date   DIRECT LARYNGOSCOPY N/A 08/13/2014   Procedure: DIRECT LARYNGOSCOPY;  Surgeon: Vernie Murders, MD;  Location: [facility];  Service: ENT;  Laterality: N/A;   EAR TUBE REMOVAL Right 1986   EXCISION NASAL MASS N/A 08/13/2014   Procedure: EXCISION NASAL MASS;  Surgeon: Vernie Murders, MD;  Location: [facility];  Service: ENT;  Laterality: N/A;   EXCISION OF TONGUE LESION Left 07/09/14   [facility]   TONSILLECTOMY AND ADENOIDECTOMY  1986   TYMPANOSTOMY TUBE PLACEMENT Bilateral 1982, 1986, 1987    No Known Allergies  Current Outpatient Medications  Medication Sig Dispense Refill   albuterol (VENTOLIN HFA) 108 (90 Base) MCG/ACT inhaler Inhale 2 puffs into the lungs every 4 (four) hours as needed. 18 g 0   Spacer/Aero-Holding Chambers (AEROCHAMBER MV) inhaler Use as instructed 1 each 2   No current facility-administered medications for this visit.    Family History Family History  Problem Relation Age of Onset   Cancer Mother       Social History Social History   Tobacco Use    Smoking status: Every Day    Packs/day: 1    Types: Cigarettes   Smokeless tobacco: Former    Quit date: 03/22/2014  Vaping Use   Vaping Use: Never used  Substance Use Topics   Alcohol use: Yes    Alcohol/week: 24.0 standard drinks of alcohol    Types: 24 Cans of beer per week   Drug use: No        Review of Systems  Constitutional: Negative.   HENT: Negative.    Eyes: Negative.   Respiratory:  Positive for cough and wheezing.   Cardiovascular: Negative.   Gastrointestinal: Negative.   Genitourinary: Negative.   Skin: Negative.   Neurological: Negative.   Psychiatric/Behavioral:  Positive for substance abuse (Tobacco).      Physical Exam Blood pressure (!) 142/91, pulse 74, temperature 98 F (36.7 C), temperature source Oral, height 6\' 2"  (1.88 m), weight 261 lb (118.4 kg), SpO2 95 %. Last Weight  Most recent update: 07/11/2022  8:35 AM    Weight  118.4 kg (261 lb)             CONSTITUTIONAL: Well developed, and nourished, appropriately responsive and aware without distress.   EYES: Sclera non-icteric.   EARS, NOSE, MOUTH AND THROAT:  The oropharynx is clear. Oral mucosa is pink and moist.    Hearing is intact to voice.  NECK: Trachea is midline, and there  is no jugular venous distension.  LYMPH NODES:  Lymph nodes in the neck are not appreciated. RESPIRATORY:  Lungs are clear, and breath sounds are equal bilaterally.  Normal respiratory effort without pathologic use of accessory muscles. CARDIOVASCULAR: Heart is regular in rate and rhythm.   Well perfused.  GI: The abdomen is soft, nontender, and nondistended.  There is reducible umbilical hernia, though slightly difficult to appreciate the fascial defect is close to 3 cm.  There were no other palpable masses.  I did not appreciate hepatosplenomegaly.  MUSCULOSKELETAL:  Symmetrical muscle tone appreciated in all four extremities.    SKIN: Skin turgor is normal. No pathologic skin lesions appreciated.  NEUROLOGIC:   Motor and sensation appear grossly normal.  Cranial nerves are grossly without defect. PSYCH:  Alert and oriented to person, place and time. Affect is appropriate for situation.  Data Reviewed I have personally reviewed what is currently available of the patient's imaging, recent labs and medical records.   Labs:     Latest Ref Rng & Units 06/23/2022    8:19 PM 10/25/2017   10:30 PM 02/16/2015    9:42 AM  CBC  WBC 4.0 - 10.5 K/uL 6.8  5.2  3.7   Hemoglobin 13.0 - 17.0 g/dL 16.1  09.6  04.5   Hematocrit 39.0 - 52.0 % 46.4  46.5  44.3   Platelets 150 - 400 K/uL 253  262  213       Latest Ref Rng & Units 06/23/2022    8:19 PM 10/25/2017   10:30 PM 02/16/2015    9:42 AM  CMP  Glucose 70 - 99 mg/dL 409  811  914   BUN 6 - 20 mg/dL 13  6  15    Creatinine 0.61 - 1.24 mg/dL 7.82  9.56  2.13   Sodium 135 - 145 mmol/L 137  136  131   Potassium 3.5 - 5.1 mmol/L 3.3  4.2  4.0   Chloride 98 - 111 mmol/L 102  104  101   CO2 22 - 32 mmol/L 24  26  23    Calcium 8.9 - 10.3 mg/dL 9.1  8.9  8.5   Total Protein 6.5 - 8.1 g/dL 7.6  7.9  6.9   Total Bilirubin 0.3 - 1.2 mg/dL 0.7  0.9  0.8   Alkaline Phos 38 - 126 U/L 46  44  44   AST 15 - 41 U/L 24  21  15    ALT 0 - 44 U/L 34  32  20       Imaging: Radiological images reviewed:  CLINICAL DATA:  Umbilical hernia pain. Patient was mowing the grass and popped out. Painful and tender to palpation.   EXAM: CT ABDOMEN AND PELVIS WITH CONTRAST   TECHNIQUE: Multidetector CT imaging of the abdomen and pelvis was performed using the standard protocol following bolus administration of intravenous contrast.   RADIATION DOSE REDUCTION: This exam was performed according to the departmental dose-optimization program which includes automated exposure control, adjustment of the mA and/or kV according to patient size and/or use of iterative reconstruction technique.   CONTRAST:  OMNIPAQUE IOHEXOL 350 MG/ML SOLN   COMPARISON:  PET/CT 03/11/2015    FINDINGS: Lower chest: No acute abnormality.   Hepatobiliary: No focal liver abnormality is seen. No gallstones, gallbladder wall thickening, or biliary dilatation.   Pancreas: Unremarkable.   Spleen: Unremarkable.   Adrenals/Urinary Tract: Normal adrenal glands. No urinary calculi or hydronephrosis. Bladder is nondistended.   Stomach/Bowel: No  evidence of obstruction. No bowel wall thickening. Colonic diverticulosis without diverticulitis. Normal appendix. Stomach is within normal limits.   Vascular/Lymphatic: No significant vascular findings are present. No enlarged abdominal or pelvic lymph nodes.   Reproductive: Unremarkable.   Other: Fat containing umbilical hernia has increased in size since PET/CT 03/11/2015. The hernia defect measures approximately 13 mm. There is associated fat stranding in the anterior peritoneum and within the herniated fat. No free intraperitoneal fluid or air.   Musculoskeletal: No acute osseous abnormality. Chronic bilateral L5 pars defects.   IMPRESSION: 1. Fat containing umbilical hernia has increased in size since PET/CT 03/11/2015. There is associated fat stranding in the anterior peritoneum and within the herniated fat which can be seen with strangulation.     Electronically Signed   By: Minerva Fester M.D.   On: 06/23/2022 22:41   Within last 24 hrs: No results found.  Assessment     Patient Active Problem List   Diagnosis Date Noted   Current smoker 07/11/2022   Umbilical hernia without obstruction and without gangrene 07/08/2019   Class 1 obesity due to excess calories without serious comorbidity with body mass index (BMI) of 34.0 to 34.9 in adult 07/08/2019   Carcinoma of tongue (HCC) 07/21/2014    Plan    We discussed various options at approaching hernia repair.  Felt prudent that we reinforce this relatively small defect, considering overall his BMI and his history of smoking.  Robotic repair of anterior abdominal  hernia proximately 3 cm fascial defect size, intention of utilizing mesh reinforcement.  We discussed the benefits of smoking cessation, and enabling more time for improving his lung function prior to surgery.  We also discussed the benefit of weight loss preoperatively.  He desires to pursue repair more urgently, considering his work and not wanting to miss any more work than necessary.  I discussed possibility of incarceration, strangulation, enlargement in size over time, and the need for emergency surgery in the face of these.  Also reviewed the techniques of reduction should incarceration occur, and when unsuccessful to present to the ED.  Also discussed that surgery risks include recurrence which can be up to 30% in the case of complex hernias, use of prosthetic materials (mesh) and the increased risk of infection and the possible need for re-operation and removal of mesh, possibility of post-op SBO or ileus, and the risks of general anesthetic including heart attack, stroke, sudden death or some reaction to anesthetic medications. The patient, and those present, appear to understand the risks, any and all questions were answered to the patient's satisfaction.  No guarantees were ever expressed or implied.   Face-to-face time spent with the patient and accompanying care providers(if present) was 30 minutes, with more than 50% of the time spent counseling, educating, and coordinating care of the patient.    These notes generated with voice recognition software. I apologize for typographical errors.  Campbell Lerner M.D., FACS 07/11/2022, 9:18 AM

## 2022-07-11 ENCOUNTER — Ambulatory Visit: Payer: Self-pay | Admitting: Surgery

## 2022-07-11 ENCOUNTER — Ambulatory Visit: Payer: No Typology Code available for payment source | Admitting: Surgery

## 2022-07-11 ENCOUNTER — Encounter: Payer: Self-pay | Admitting: Surgery

## 2022-07-11 ENCOUNTER — Telehealth: Payer: Self-pay | Admitting: Surgery

## 2022-07-11 VITALS — BP 142/91 | HR 74 | Temp 98.0°F | Ht 74.0 in | Wt 261.0 lb

## 2022-07-11 DIAGNOSIS — K429 Umbilical hernia without obstruction or gangrene: Secondary | ICD-10-CM

## 2022-07-11 DIAGNOSIS — F17210 Nicotine dependence, cigarettes, uncomplicated: Secondary | ICD-10-CM | POA: Diagnosis not present

## 2022-07-11 DIAGNOSIS — F17200 Nicotine dependence, unspecified, uncomplicated: Secondary | ICD-10-CM | POA: Insufficient documentation

## 2022-07-11 NOTE — Patient Instructions (Signed)
Our surgery scheduler Barbara will call you within 24-48 hours to get you scheduled. If you have not heard from her after 48 hours, please call our office. Have the blue sheet available when she calls to write down important information.   If you have any concerns or questions, please feel free to call our office.   Umbilical Hernia, Adult  A hernia is a bulge of tissue that pushes through an opening between muscles. An umbilical hernia happens in the abdomen, near the belly button (umbilicus). The hernia may contain tissues from the small intestine, large intestine, or fatty tissue covering the intestines. Umbilical hernias in adults tend to get worse over time, and they require surgical treatment. There are different types of umbilical hernias, including: Indirect hernia. This type is located just above or below the umbilicus. It is the most common type of umbilical hernia in adults. Direct hernia. This type forms through an opening formed by the umbilicus. Reducible hernia. This type of hernia comes and goes. It may be visible only when you strain, lift something heavy, or cough. This type of hernia can be pushed back into the abdomen (reduced). Incarcerated hernia. This type traps abdominal tissue inside the hernia. This type of hernia cannot be reduced. Strangulated hernia. This type of hernia cuts off blood flow to the tissues inside the hernia. The tissues can start to die if this happens. This type of hernia requires emergency treatment. What are the causes? An umbilical hernia happens when tissue inside the abdomen presses on a weak area of the abdominal muscles. What increases the risk? You may have a greater risk of this condition if you: Are obese. Have had several pregnancies. Have a buildup of fluid inside your abdomen. Have had surgery that weakens the abdominal muscles. What are the signs or symptoms? The main symptom of this condition is a painless bulge at or near the belly  button. A reducible hernia may be visible only when you strain, lift something heavy, or cough. Other symptoms may include: Dull pain. A feeling of pressure. Symptoms of a strangulated hernia may include: Pain that gets increasingly worse. Nausea and vomiting. Pain when pressing on the hernia. Skin over the hernia becoming red or purple. Constipation. Blood in the stool. How is this diagnosed? This condition may be diagnosed based on: A physical exam. You may be asked to cough or strain while standing. These actions increase the pressure inside your abdomen and can force the hernia through the opening in your muscles. Your health care provider may try to reduce the hernia by pressing on it. Your symptoms and medical history. How is this treated? Surgery is the only treatment for an umbilical hernia. Surgery for a strangulated hernia is done as soon as possible. If you have a small hernia that is not incarcerated, you may need to lose weight before having surgery. Follow these instructions at home: Lose weight, if told by your health care provider. Do not try to push the hernia back in. Watch your hernia for any changes in color or size. Tell your health care provider if any changes occur. You may need to avoid activities that increase pressure on your hernia. Do not lift anything that is heavier than 10 lb (4.5 kg), or the limit that you are told, until your health care provider says that it is safe. Take over-the-counter and prescription medicines only as told by your health care provider. Keep all follow-up visits. This is important. Contact a health care   provider if: Your hernia gets larger. Your hernia becomes painful. Get help right away if: You develop sudden, severe pain near the area of your hernia. You have pain as well as nausea or vomiting. You have pain and the skin over your hernia changes color. You develop a fever or chills. Summary A hernia is a bulge of tissue that  pushes through an opening between muscles. An umbilical hernia happens near the belly button. Surgery is the only treatment for an umbilical hernia. Do not try to push your hernia back in. Keep all follow-up visits. This is important. This information is not intended to replace advice given to you by your health care provider. Make sure you discuss any questions you have with your health care provider. Document Revised: 10/06/2019 Document Reviewed: 10/06/2019 Elsevier Patient Education  2023 [employer]  

## 2022-07-11 NOTE — Telephone Encounter (Signed)
Left message for patient to call, please inform him of the following regarding scheduled surgery with Dr. Claudine Mouton.    Pre-Admission date/time, and Surgery date at [facility].  Surgery Date: 07/17/22 Preadmission Testing Date: 07/13/22 (phone 1p-4p)  Also patient will  need to call at [phone], between 1-3:00pm the day before surgery, to find out what time to arrive for surgery.

## 2022-07-11 NOTE — Telephone Encounter (Signed)
Patient and wife call back, they are informed of all dates regarding surgery.

## 2022-07-13 ENCOUNTER — Encounter
Admission: RE | Admit: 2022-07-13 | Discharge: 2022-07-13 | Disposition: A | Discharge status: Home / Self Care | Payer: No Typology Code available for payment source | Source: Ambulatory Visit | Attending: Surgery | Admitting: Surgery

## 2022-07-13 HISTORY — DX: Umbilical hernia without obstruction or gangrene: K42.9

## 2022-07-13 HISTORY — DX: Unspecified asthma, uncomplicated: J45.909

## 2022-07-13 HISTORY — DX: Elevated blood-pressure reading, without diagnosis of hypertension: R03.0

## 2022-07-13 HISTORY — DX: Nicotine dependence, unspecified, uncomplicated: F17.200

## 2022-07-13 HISTORY — DX: Obesity, unspecified: E66.9

## 2022-07-13 NOTE — Patient Instructions (Signed)
Your procedure is scheduled on:07-17-22 Monday Report to the Registration Desk on the 1st floor of the [facility].Then proceed to the 2nd floor Surgery Desk To find out your arrival time, please call [phone] between 1PM - 3PM on:07-14-22 Friday If your arrival time is 6:00 am, do not arrive before that time as the [facility] entrance doors do not open until 6:00 am.  REMEMBER: Instructions that are not followed completely may result in serious medical risk, up to and including death; or upon the discretion of your surgeon and anesthesiologist your surgery may need to be rescheduled.  Do not eat food OR drink any liquids after midnight the night before surgery.  No gum chewing or hard candies.  One week prior to surgery: Stop Anti-inflammatories (NSAIDS) such as Advil, Aleve, Ibuprofen, Motrin, Naproxen, Naprosyn and Aspirin based products such as Excedrin, Goody's Powder, BC Powder.You may however, take Tylenol if needed for pain up until the day of surgery. Stop ANY OVER THE COUNTER supplements/vitamins NOW (07-13-22) until after surgery.  Do NOT take any medication the day of surgery  Use your Albuterol Nebulizer the morning of your surgery  No Alcohol for 24 hours before or after surgery.  No Smoking including e-cigarettes for 24 hours before surgery.  No chewable tobacco products for at least 6 hours before surgery.  No nicotine patches on the day of surgery.  Do not use any "recreational" drugs for at least a week (preferably 2 weeks) before your surgery.  Please be advised that the combination of cocaine and anesthesia may have negative outcomes, up to and including death. If you test positive for cocaine, your surgery will be cancelled.  On the morning of surgery brush your teeth with toothpaste and water, you may rinse your mouth with mouthwash if you wish. Do not swallow any toothpaste or mouthwash.  Use CHG Soap as directed on instruction sheet.  Do not wear jewelry,  make-up, hairpins, clips or nail polish.  Do not wear lotions, powders, or perfumes.   Do not shave body hair from the neck down 48 hours before surgery.  Contact lenses, hearing aids and dentures may not be worn into surgery.  Do not bring valuables to the hospital. [facility] is not responsible for any missing/lost belongings or valuables.   Notify your doctor if there is any change in your medical condition (cold, fever, infection).  Wear comfortable clothing (specific to your surgery type) to the hospital.  After surgery, you can help prevent lung complications by doing breathing exercises.  Take deep breaths and cough every 1-2 hours. Your doctor may order a device called an Incentive Spirometer to help you take deep breaths. When coughing or sneezing, hold a pillow firmly against your incision with both hands. This is called "splinting." Doing this helps protect your incision. It also decreases belly discomfort.  If you are being admitted to the hospital overnight, leave your suitcase in the car. After surgery it may be brought to your room.  In case of increased patient census, it may be necessary for you, the patient, to continue your postoperative care in the Same Day Surgery department.  If you are being discharged the day of surgery, you will not be allowed to drive home. You will need a responsible individual to drive you home and stay with you for 24 hours after surgery.   If you are taking public transportation, you will need to have a responsible individual with you.  Please call the Pre-admissions  Testing Dept. at [phone] if you have any questions about these instructions.  Surgery Visitation Policy:  Patients having surgery or a procedure may have two visitors.  Children under the age of 69 must have an adult with them who is not the patient.     Preparing for Surgery with CHLORHEXIDINE GLUCONATE (CHG) Soap  Chlorhexidine Gluconate (CHG) Soap  o An  antiseptic cleaner that kills germs and bonds with the skin to continue killing germs even after washing  o Used for showering the night before surgery and morning of surgery  Before surgery, you can play an important role by reducing the number of germs on your skin.  CHG (Chlorhexidine gluconate) soap is an antiseptic cleanser which kills germs and bonds with the skin to continue killing germs even after washing.  Please do not use if you have an allergy to CHG or antibacterial soaps. If your skin becomes reddened/irritated stop using the CHG.  1. Shower the NIGHT BEFORE SURGERY and the MORNING OF SURGERY with CHG soap.  2. If you choose to wash your hair, wash your hair first as usual with your normal shampoo.  3. After shampooing, rinse your hair and body thoroughly to remove the shampoo.  4. Use CHG as you would any other liquid soap. You can apply CHG directly to the skin and wash gently with a scrungie or a clean washcloth.  5. Apply the CHG soap to your body only from the neck down. Do not use on open wounds or open sores. Avoid contact with your eyes, ears, mouth, and genitals (private parts). Wash face and genitals (private parts) with your normal soap.  6. Wash thoroughly, paying special attention to the area where your surgery will be performed.  7. Thoroughly rinse your body with warm water.  8. Do not shower/wash with your normal soap after using and rinsing off the CHG soap.  9. Pat yourself dry with a clean towel.  10. Wear clean pajamas to bed the night before surgery.  12. Place clean sheets on your bed the night of your first shower and do not sleep with pets.  13. Shower again with the CHG soap on the day of surgery prior to arriving at the hospital.  14. Do not apply any deodorants/lotions/powders.  15. Please wear clean clothes to the hospital.

## 2022-07-17 ENCOUNTER — Ambulatory Visit
Admission: RE | Admit: 2022-07-17 | Discharge: 2022-07-17 | Disposition: A | Discharge status: Home / Self Care | Payer: PRIVATE HEALTH INSURANCE | Attending: Surgery | Admitting: Surgery

## 2022-07-17 ENCOUNTER — Ambulatory Visit: Payer: PRIVATE HEALTH INSURANCE | Admitting: Anesthesiology

## 2022-07-17 ENCOUNTER — Other Ambulatory Visit: Payer: Self-pay

## 2022-07-17 ENCOUNTER — Encounter: Payer: Self-pay | Admitting: Surgery

## 2022-07-17 ENCOUNTER — Encounter
Admission: RE | Disposition: A | Discharge status: Home / Self Care | Payer: Self-pay | Source: Home / Self Care | Attending: Surgery

## 2022-07-17 DIAGNOSIS — F17210 Nicotine dependence, cigarettes, uncomplicated: Secondary | ICD-10-CM | POA: Insufficient documentation

## 2022-07-17 DIAGNOSIS — J45909 Unspecified asthma, uncomplicated: Secondary | ICD-10-CM | POA: Insufficient documentation

## 2022-07-17 DIAGNOSIS — K429 Umbilical hernia without obstruction or gangrene: Secondary | ICD-10-CM | POA: Insufficient documentation

## 2022-07-17 HISTORY — PX: INSERTION OF MESH: SHX5868

## 2022-07-17 SURGERY — REPAIR, HERNIA, UMBILICAL, ROBOT-ASSISTED
Anesthesia: General

## 2022-07-17 MED ORDER — LIDOCAINE HCL (CARDIAC) PF 100 MG/5ML IV SOSY
PREFILLED_SYRINGE | INTRAVENOUS | Status: DC | PRN
  Administered 2022-07-17: 60 mg via INTRAVENOUS

## 2022-07-17 MED ORDER — CHLORHEXIDINE GLUCONATE 0.12 % MT SOLN: 15.0000 mL | Freq: Once | OROMUCOSAL | Status: DC

## 2022-07-17 MED ORDER — FAMOTIDINE 20 MG PO TABS
20.0000 mg | ORAL_TABLET | Freq: Once | ORAL | Status: AC
  Administered 2022-07-17: 20 mg via ORAL

## 2022-07-17 MED ORDER — BUPIVACAINE LIPOSOME 1.3 % IJ SUSP: 20.0000 mL | Freq: Once | INTRAMUSCULAR | Status: DC

## 2022-07-17 MED ORDER — BUPIVACAINE-EPINEPHRINE 0.25% -1:200000 IJ SOLN
INTRAMUSCULAR | Status: DC | PRN
  Administered 2022-07-17: 30 mL

## 2022-07-17 MED ORDER — ORAL CARE MOUTH RINSE: 15.0000 mL | Freq: Once | OROMUCOSAL | Status: DC

## 2022-07-17 MED ORDER — MIDAZOLAM HCL 2 MG/2ML IJ SOLN
INTRAMUSCULAR | Status: AC
  Filled 2022-07-17: qty 2

## 2022-07-17 MED ORDER — PROPOFOL 10 MG/ML IV BOLUS
INTRAVENOUS | Status: DC | PRN
  Administered 2022-07-17: 250 mg via INTRAVENOUS
  Administered 2022-07-17: 100 mg via INTRAVENOUS

## 2022-07-17 MED ORDER — LIDOCAINE HCL (PF) 2 % IJ SOLN
INTRAMUSCULAR | Status: AC
  Filled 2022-07-17: qty 5

## 2022-07-17 MED ORDER — SUGAMMADEX SODIUM 200 MG/2ML IV SOLN
INTRAVENOUS | Status: DC | PRN
  Administered 2022-07-17: 200 mg via INTRAVENOUS

## 2022-07-17 MED ORDER — ROCURONIUM BROMIDE 10 MG/ML (PF) SYRINGE
PREFILLED_SYRINGE | INTRAVENOUS | Status: AC
  Filled 2022-07-17: qty 10

## 2022-07-17 MED ORDER — DEXMEDETOMIDINE HCL IN NACL 80 MCG/20ML IV SOLN
INTRAVENOUS | Status: DC | PRN
  Administered 2022-07-17: 8 ug via INTRAVENOUS
  Administered 2022-07-17: 12 ug via INTRAVENOUS

## 2022-07-17 MED ORDER — ALBUTEROL SULFATE HFA 108 (90 BASE) MCG/ACT IN AERS
INHALATION_SPRAY | RESPIRATORY_TRACT | Status: AC
  Filled 2022-07-17: qty 6.7

## 2022-07-17 MED ORDER — HYDROCODONE-ACETAMINOPHEN 5-325 MG PO TABS: 1.0000 | ORAL_TABLET | Freq: Four times a day (QID) | ORAL | 0 refills | Status: DC | PRN

## 2022-07-17 MED ORDER — ACETAMINOPHEN 500 MG PO TABS
ORAL_TABLET | ORAL | Status: AC
  Filled 2022-07-17: qty 2

## 2022-07-17 MED ORDER — OXYCODONE HCL 5 MG PO TABS
5.0000 mg | ORAL_TABLET | Freq: Once | ORAL | Status: AC
  Administered 2022-07-17: 5 mg via ORAL

## 2022-07-17 MED ORDER — CHLORHEXIDINE GLUCONATE CLOTH 2 % EX PADS: 6.0000 | MEDICATED_PAD | Freq: Once | CUTANEOUS | Status: DC

## 2022-07-17 MED ORDER — EPINEPHRINE PF 1 MG/ML IJ SOLN
INTRAMUSCULAR | Status: AC
  Filled 2022-07-17: qty 1

## 2022-07-17 MED ORDER — ONDANSETRON HCL 4 MG/2ML IJ SOLN
INTRAMUSCULAR | Status: DC | PRN
  Administered 2022-07-17: 8 mg via INTRAVENOUS

## 2022-07-17 MED ORDER — IBUPROFEN 800 MG PO TABS
800.0000 mg | ORAL_TABLET | Freq: Three times a day (TID) | ORAL | 0 refills | Status: AC | PRN
Start: 2022-07-17 — End: ?

## 2022-07-17 MED ORDER — BUPIVACAINE HCL (PF) 0.25 % IJ SOLN
INTRAMUSCULAR | Status: AC
  Filled 2022-07-17: qty 30

## 2022-07-17 MED ORDER — CEFAZOLIN IN SODIUM CHLORIDE 3-0.9 GM/100ML-% IV SOLN
3.0000 g | INTRAVENOUS | Status: AC
  Administered 2022-07-17: 3 g via INTRAVENOUS
  Filled 2022-07-17: qty 100

## 2022-07-17 MED ORDER — PROPOFOL 10 MG/ML IV BOLUS
INTRAVENOUS | Status: AC
  Filled 2022-07-17: qty 20

## 2022-07-17 MED ORDER — ROCURONIUM BROMIDE 100 MG/10ML IV SOLN
INTRAVENOUS | Status: DC | PRN
  Administered 2022-07-17: 5 mg via INTRAVENOUS
  Administered 2022-07-17: 10 mg via INTRAVENOUS
  Administered 2022-07-17: 15 mg via INTRAVENOUS
  Administered 2022-07-17: 60 mg via INTRAVENOUS

## 2022-07-17 MED ORDER — FENTANYL CITRATE (PF) 100 MCG/2ML IJ SOLN
INTRAMUSCULAR | Status: AC
  Filled 2022-07-17: qty 2

## 2022-07-17 MED ORDER — CELECOXIB 200 MG PO CAPS
200.0000 mg | ORAL_CAPSULE | ORAL | Status: AC
  Administered 2022-07-17: 200 mg via ORAL

## 2022-07-17 MED ORDER — OXYCODONE HCL 5 MG PO TABS
ORAL_TABLET | ORAL | Status: AC
  Filled 2022-07-17: qty 1

## 2022-07-17 MED ORDER — PROMETHAZINE HCL 25 MG/ML IJ SOLN: 6.2500 mg | INTRAMUSCULAR | Status: DC | PRN

## 2022-07-17 MED ORDER — DEXAMETHASONE SODIUM PHOSPHATE 10 MG/ML IJ SOLN
INTRAMUSCULAR | Status: AC
  Filled 2022-07-17: qty 1

## 2022-07-17 MED ORDER — FAMOTIDINE 20 MG PO TABS
ORAL_TABLET | ORAL | Status: AC
  Filled 2022-07-17: qty 1

## 2022-07-17 MED ORDER — ALBUTEROL SULFATE HFA 108 (90 BASE) MCG/ACT IN AERS
INHALATION_SPRAY | RESPIRATORY_TRACT | Status: DC | PRN
  Administered 2022-07-17: 4 via RESPIRATORY_TRACT

## 2022-07-17 MED ORDER — ONDANSETRON HCL 4 MG/2ML IJ SOLN
INTRAMUSCULAR | Status: AC
  Filled 2022-07-17: qty 2

## 2022-07-17 MED ORDER — DEXAMETHASONE SODIUM PHOSPHATE 10 MG/ML IJ SOLN
INTRAMUSCULAR | Status: DC | PRN
  Administered 2022-07-17: 10 mg via INTRAVENOUS

## 2022-07-17 MED ORDER — ACETAMINOPHEN 500 MG PO TABS
1000.0000 mg | ORAL_TABLET | ORAL | Status: AC
  Administered 2022-07-17: 1000 mg via ORAL

## 2022-07-17 MED ORDER — FENTANYL CITRATE (PF) 100 MCG/2ML IJ SOLN
INTRAMUSCULAR | Status: DC | PRN
  Administered 2022-07-17 (×4): 50 ug via INTRAVENOUS

## 2022-07-17 MED ORDER — CELECOXIB 200 MG PO CAPS
ORAL_CAPSULE | ORAL | Status: AC
  Filled 2022-07-17: qty 1

## 2022-07-17 MED ORDER — CHLORHEXIDINE GLUCONATE 0.12 % MT SOLN
OROMUCOSAL | Status: AC
  Filled 2022-07-17: qty 15

## 2022-07-17 MED ORDER — FENTANYL CITRATE (PF) 100 MCG/2ML IJ SOLN
25.0000 ug | INTRAMUSCULAR | Status: DC | PRN
  Administered 2022-07-17 (×3): 50 ug via INTRAVENOUS

## 2022-07-17 MED ORDER — LACTATED RINGERS IV SOLN: INTRAVENOUS | Status: DC

## 2022-07-17 MED ORDER — GABAPENTIN 300 MG PO CAPS
ORAL_CAPSULE | ORAL | Status: AC
  Filled 2022-07-17: qty 1

## 2022-07-17 MED ORDER — MIDAZOLAM HCL 2 MG/2ML IJ SOLN
INTRAMUSCULAR | Status: DC | PRN
  Administered 2022-07-17: 2 mg via INTRAVENOUS

## 2022-07-17 MED ORDER — GABAPENTIN 300 MG PO CAPS
300.0000 mg | ORAL_CAPSULE | ORAL | Status: AC
  Administered 2022-07-17: 300 mg via ORAL

## 2022-07-17 SURGICAL SUPPLY — 51 items
ADH SKN CLS APL DERMABOND .7 (GAUZE/BANDAGES/DRESSINGS) ×2
APL PRP STRL LF DISP 70% ISPRP (MISCELLANEOUS) ×2
BINDER ABDOMINAL 12 ML 46-62 (SOFTGOODS) IMPLANT
CHLORAPREP W/TINT 26 (MISCELLANEOUS) ×2 IMPLANT
COVER TIP SHEARS 8 DVNC (MISCELLANEOUS) ×2 IMPLANT
COVER WAND RF STERILE (DRAPES) ×2 IMPLANT
DERMABOND ADVANCED .7 DNX12 (GAUZE/BANDAGES/DRESSINGS) ×2 IMPLANT
DRAPE ARM DVNC X/XI (DISPOSABLE) ×6 IMPLANT
DRAPE COLUMN DVNC XI (DISPOSABLE) ×2 IMPLANT
ELECT REM PT RETURN 9FT ADLT (ELECTROSURGICAL) ×2
ELECTRODE REM PT RTRN 9FT ADLT (ELECTROSURGICAL) ×2 IMPLANT
FORCEPS BPLR R/ABLATION 8 DVNC (INSTRUMENTS) ×2 IMPLANT
GLOVE ORTHO TXT STRL SZ7.5 (GLOVE) ×4 IMPLANT
GOWN STRL REUS W/ TWL LRG LVL3 (GOWN DISPOSABLE) ×2 IMPLANT
GOWN STRL REUS W/ TWL XL LVL3 (GOWN DISPOSABLE) ×4 IMPLANT
GOWN STRL REUS W/TWL LRG LVL3 (GOWN DISPOSABLE) ×2
GOWN STRL REUS W/TWL XL LVL3 (GOWN DISPOSABLE) ×4
GRASPER SUT TROCAR 14GX15 (MISCELLANEOUS) IMPLANT
IRRIGATION STRYKERFLOW (MISCELLANEOUS) IMPLANT
IRRIGATOR STRYKERFLOW (MISCELLANEOUS)
IV NS IRRIG 3000ML ARTHROMATIC (IV SOLUTION) IMPLANT
KIT PINK PAD W/HEAD ARE REST (MISCELLANEOUS) ×2
KIT PINK PAD W/HEAD ARM REST (MISCELLANEOUS) ×2 IMPLANT
KIT TURNOVER KIT A (KITS) ×2 IMPLANT
MANIFOLD NEPTUNE II (INSTRUMENTS) ×2 IMPLANT
MESH VENTRALIGHT ST 4.5IN (Mesh General) IMPLANT
NDL DRIVE SUT CUT DVNC (INSTRUMENTS) ×2 IMPLANT
NDL HYPO 22X1.5 SAFETY MO (MISCELLANEOUS) ×2 IMPLANT
NDL INSUFFLATION 14GA 120MM (NEEDLE) ×2 IMPLANT
NEEDLE DRIVE SUT CUT DVNC (INSTRUMENTS) ×2 IMPLANT
NEEDLE HYPO 22X1.5 SAFETY MO (MISCELLANEOUS) ×2 IMPLANT
NEEDLE INSUFFLATION 14GA 120MM (NEEDLE) ×2 IMPLANT
NS IRRIG 500ML POUR BTL (IV SOLUTION) ×2 IMPLANT
PACK LAP CHOLECYSTECTOMY (MISCELLANEOUS) ×2 IMPLANT
SCISSORS METZENBAUM CVD 33 (INSTRUMENTS) IMPLANT
SCISSORS MNPLR CVD DVNC XI (INSTRUMENTS) ×2 IMPLANT
SEAL UNIV 5-12 XI (MISCELLANEOUS) ×6 IMPLANT
SET TUBE SMOKE EVAC HIGH FLOW (TUBING) ×2 IMPLANT
SOL ELECTROSURG ANTI STICK (MISCELLANEOUS) ×2
SOLUTION ELECTROSURG ANTI STCK (MISCELLANEOUS) ×2 IMPLANT
SPIKE FLUID TRANSFER (MISCELLANEOUS) ×2 IMPLANT
SUT MNCRL 4-0 (SUTURE) ×2
SUT MNCRL 4-0 27XMFL (SUTURE) ×2
SUT STRATAFIX 0 PDS+ CT-2 23 (SUTURE) ×2
SUT VICRYL 0 UR6 27IN ABS (SUTURE) ×2 IMPLANT
SUT VLOC 90 S/L VL9 GS22 (SUTURE) ×4 IMPLANT
SUTURE MNCRL 4-0 27XMF (SUTURE) ×2 IMPLANT
SUTURE STRATFX 0 PDS+ CT-2 23 (SUTURE) ×2 IMPLANT
TRAP FLUID SMOKE EVACUATOR (MISCELLANEOUS) ×2 IMPLANT
TROCAR Z-THREAD FIOS 11X100 BL (TROCAR) ×2 IMPLANT
WATER STERILE IRR 500ML POUR (IV SOLUTION) ×2 IMPLANT

## 2022-07-17 NOTE — Anesthesia Procedure Notes (Signed)
Procedure Name: Intubation Date/Time: 07/17/2022 7:39 AM  Performed by: Jeannene Patella, CRNAPre-anesthesia Checklist: Patient identified, Emergency Drugs available, Suction available, Patient being monitored and Timeout performed Patient Re-evaluated:Patient Re-evaluated prior to induction Oxygen Delivery Method: Circle system utilized Preoxygenation: Pre-oxygenation with 100% oxygen Induction Type: IV induction Ventilation: Mask ventilation with difficulty and Oral airway inserted - appropriate to patient size Laryngoscope Size: McGraph and 4 Grade View: Grade I Tube type: Oral Tube size: 7.5 mm Number of attempts: 1 Airway Equipment and Method: Stylet and Video-laryngoscopy Placement Confirmation: ETT inserted through vocal cords under direct vision, positive ETCO2 and breath sounds checked- equal and bilateral Secured at: 22 (at teeth) cm Tube secured with: Tape Dental Injury: Teeth and Oropharynx as per pre-operative assessment  Comments: Poor dentition, large chips noted preop teeth unchanged s/p intubation Distant bilateral equal bs

## 2022-07-17 NOTE — Transfer of Care (Signed)
Immediate Anesthesia Transfer of Care Note  Patient: Ross Chandler  Procedure(s) Performed: XI ROBOT ASSISTED UMBILICAL HERNIA REPAIR INSERTION OF MESH  Patient Location: PACU  Anesthesia Type:General  Level of Consciousness: awake, oriented, and patient cooperative  Airway & Oxygen Therapy: Patient Spontanous Breathing and Patient connected to face mask oxygen  Post-op Assessment: Report given to RN and Post -op Vital signs reviewed and stable  Post vital signs: Reviewed and stable  Last Vitals:  Vitals Value Taken Time  BP 152/97 07/17/22 0945  Temp 36.8 C 07/17/22 0945  Pulse 84 07/17/22 0947  Resp 15 07/17/22 0947  SpO2 100 % 07/17/22 0947  Vitals shown include unvalidated device data.  Last Pain:  Vitals:   07/17/22 0623  TempSrc: Temporal  PainSc: 0-No pain         Complications: No notable events documented.

## 2022-07-17 NOTE — Discharge Instructions (Signed)
AMBULATORY SURGERY  ?DISCHARGE INSTRUCTIONS ? ? ?The drugs that you were given will stay in your system until tomorrow so for the next 24 hours you should not: ? ?Drive an automobile ?Make any legal decisions ?Drink any alcoholic beverage ? ? ?You may resume regular meals tomorrow.  Today it is better to start with liquids and gradually work up to solid foods. ? ?You may eat anything you prefer, but it is better to start with liquids, then soup and crackers, and gradually work up to solid foods. ? ? ?Please notify your doctor immediately if you have any unusual bleeding, trouble breathing, redness and pain at the surgery site, drainage, fever, or pain not relieved by medication. ? ? ? ?Additional Instructions: ? ? ? ?Please contact your physician with any problems or Same Day Surgery at [phone], Monday through Friday 6 am to 4 pm, or [facility] [place] Main number at [phone].  ?

## 2022-07-17 NOTE — Op Note (Signed)
Robotic assisted laparoscopic anterior abdominal hernia repair, initial, reducible 2+ cm defect.    Pre-operative Diagnosis: Anterior abdominal hernia(umbilical), preop estimated fascial defect size  3 cm.   Post-operative Diagnosis: same, but fascial defect is 2+cm.    Surgeon:  Campbell Lerner, M.D., FACS   Anesthesia: Gen. with endotracheal tube   Findings:  2+ CM fascial defect.  Estimated Blood Loss: 15 mL   Complications: none           Procedure Details  The patient was seen again in the Holding Room. The benefits, complications, treatment options, and expected outcomes were discussed with the patient. The risks of bleeding, infection, recurrence of symptoms, failure to resolve symptoms, bowel injury, mesh placement, mesh infection, any of which could require further surgery were reviewed with the patient. The likelihood of improving the patient's symptoms with return to their baseline status is good.  The patient and/or family concurred with the proposed plan, giving informed consent.  The patient was taken to Operating Room, identified and the procedure verified.  A Time Out was held and the above information confirmed.   Prior to the induction of general anesthesia, antibiotic prophylaxis was administered. VTE prophylaxis was in place. General endotracheal anesthesia was then administered and tolerated well. After the induction, the abdomen was prepped with Chloraprep and draped in the sterile fashion. The patient was positioned in the supine position. After local infiltration of quarter percent Marcaine with epinephrine, stab incision was made left upper quadrant.  Just below the costal margin approximately midclavicular line the Veress needle is passed with sensation of the layers to penetrate the abdominal wall and into the peritoneum.  Saline drop test is confirmed peritoneal placement.  Insufflation is initiated with carbon dioxide to pressures of 15 mmHg. Marcaine quarter percent   was used to inject all the incision sites. We used a left lateral incision and using an [employer] FIOS 11 mm trocar, it was inserted with direct visualization and pneumoperitoneum maintained.  No hemodynamic compromise, no evidence of intraperitoneal injury.   2 additional 8 mm ports were placed under direct visualization on the left lateral abdomen.  I visualized the hernia and there was preperitoneal contents within an umbilical hernia.   I selected the 11 cm circle VentralLight ST, and placed it in the abdomen.  The robot was brought to the surgical field and docked in the standard fashion.  We made sure that all instrumentation was kept under direct vision at all times and there was no collision between the arms.  I scrubbed out and went to the console. I elected to pursue full coverage of the mesh upon completion and started the peritoneal dissection to accommodate this mesh. Falciform and adjacent preperitoneal adipose and umbilical ligaments and hernia sac were taken down with sharp dissection and electrocautery to allow adequate mesh placement to be secured to the fascial tissue. Confirm and measured that the defect was 2+ cm, clearly less than 3.  Using a 0 Stratafix suture we closed the ventral defect primarily along the midline, extending slightly onto the mildly diastatic linea alba.   I used the same suture to secure the mesh and centered it over the fascial closure.    The mesh was secured circumferentially to the abdominal wall using 2-0 V-lock in the standard fashion.  The mesh appeared well secured against the  abdominal wall. I closed the peritoneal opening, providing 99+ % peritoneal coverage.    A second look laparoscopy revealed no evidence of intra-abdominal  injury.    All the needles were removed under direct visualization.  The instruments were removed and the robot was undocked.  The laparoscopic ports were removed under direct visualization and the pneumoperitoneum was  deflated.   The fascial sutures approximated in the standard fashion,  utilizing the PMI under direct visualization. Incisions were closed with  4-0 Monocryl   Dermabond was used to coat the skin.  Patient tolerated procedure well and there were no immediate complications. Needle and laparotomy counts were correct   Abdominal binder applied.  Campbell Lerner M.D., [facility] 07/17/2022 9:41 AM

## 2022-07-17 NOTE — Interval H&P Note (Signed)
History and Physical Interval Note:  07/17/2022 7:11 AM  Ross Chandler  has presented today for surgery, with the diagnosis of umbilical  hernia less 3 cm.  The various methods of treatment have been discussed with the patient and family. After consideration of risks, benefits and other options for treatment, the patient has consented to  Procedure(s): XI ROBOT ASSISTED UMBILICAL HERNIA REPAIR (N/A) as a surgical intervention.  The patient's history has been reviewed, patient examined, no change in status, stable for surgery.  I have reviewed the patient's chart and labs.  Questions were answered to the patient's satisfaction.     Campbell Lerner

## 2022-07-17 NOTE — Anesthesia Preprocedure Evaluation (Signed)
Anesthesia Evaluation  Patient identified by MRN, date of birth, ID band Patient awake    Reviewed: Allergy & Precautions, H&P , NPO status , Patient's Chart, lab work & pertinent test results, reviewed documented beta blocker date and time   History of Anesthesia Complications Negative for: history of anesthetic complications  Airway Mallampati: II  TM Distance: >3 FB Neck ROM: full    Dental  (+) Dental Advidsory Given, Chipped, Teeth Intact, Poor Dentition   Pulmonary neg shortness of breath, asthma , neg sleep apnea, neg COPD, neg recent URI, Current Smoker and Patient abstained from smoking.   Pulmonary exam normal breath sounds clear to auscultation       Cardiovascular Exercise Tolerance: Good negative cardio ROS Normal cardiovascular exam Rhythm:regular Rate:Normal     Neuro/Psych negative neurological ROS  negative psych ROS   GI/Hepatic negative GI ROS, Neg liver ROS,,,  Endo/Other  negative endocrine ROS    Renal/GU negative Renal ROS  negative genitourinary   Musculoskeletal   Abdominal   Peds  Hematology negative hematology ROS (+)   Anesthesia Other Findings Past Medical History: No date: Asthma No date: Current smoker No date: Elevated blood pressure reading No date: Obesity left-small portion of tongue removed: 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) No date: Umbilical hernia without obstruction and without gangrene   Reproductive/Obstetrics negative OB ROS                             Anesthesia Physical Anesthesia Plan  ASA: 2  Anesthesia Plan: General   Post-op Pain Management:    Induction: Intravenous  PONV Risk Score and Plan: 1 and Ondansetron, Dexamethasone, Midazolam and Treatment may vary due to age or medical condition  Airway Management Planned: Oral ETT  Additional Equipment:   Intra-op Plan:   Post-operative Plan: Extubation in  OR  Informed Consent: I have reviewed the patients History and Physical, chart, labs and discussed the procedure including the risks, benefits and alternatives for the proposed anesthesia with the patient or authorized representative who has indicated his/her understanding and acceptance.     Dental Advisory Given  Plan Discussed with: Anesthesiologist, CRNA and Surgeon  Anesthesia Plan Comments:        Anesthesia Quick Evaluation

## 2022-07-18 ENCOUNTER — Encounter: Payer: Self-pay | Admitting: Surgery

## 2022-07-18 NOTE — Anesthesia Postprocedure Evaluation (Signed)
Anesthesia Post Note  Patient: Ross Chandler  Procedure(s) Performed: XI ROBOT ASSISTED UMBILICAL HERNIA REPAIR INSERTION OF MESH  Patient location during evaluation: PACU Anesthesia Type: General Level of consciousness: awake and alert Pain management: pain level controlled Vital Signs Assessment: post-procedure vital signs reviewed and stable Respiratory status: spontaneous breathing, nonlabored ventilation, respiratory function stable and patient connected to nasal cannula oxygen Cardiovascular status: blood pressure returned to baseline and stable Postop Assessment: no apparent nausea or vomiting Anesthetic complications: no   No notable events documented.   Last Vitals:  Vitals:   07/17/22 1041 07/17/22 1100  BP:  (!) 157/99  Pulse: 73 60  Resp: (!) 23 18  Temp:  (!) 36.1 C  SpO2: 96% 98%    Last Pain:  Vitals:   07/17/22 1100  TempSrc: Temporal  PainSc: 4                  Lenard Simmer

## 2022-07-27 ENCOUNTER — Other Ambulatory Visit: Payer: Self-pay | Admitting: Surgery

## 2022-08-01 ENCOUNTER — Ambulatory Visit: Payer: No Typology Code available for payment source | Admitting: Surgery

## 2023-06-05 ENCOUNTER — Other Ambulatory Visit: Payer: Self-pay | Admitting: Otolaryngology

## 2023-06-05 ENCOUNTER — Encounter: Payer: Self-pay | Admitting: Otolaryngology

## 2023-06-05 DIAGNOSIS — D3702 Neoplasm of uncertain behavior of tongue: Secondary | ICD-10-CM

## 2023-06-06 ENCOUNTER — Encounter: Payer: Self-pay | Admitting: Otolaryngology

## 2023-06-08 ENCOUNTER — Inpatient Hospital Stay: Admission: RE | Admit: 2023-06-08 | Payer: PRIVATE HEALTH INSURANCE | Source: Ambulatory Visit

## 2023-06-15 ENCOUNTER — Ambulatory Visit
Admission: RE | Admit: 2023-06-15 | Discharge: 2023-06-15 | Disposition: A | Discharge status: Home / Self Care | Source: Ambulatory Visit | Attending: Otolaryngology | Admitting: Otolaryngology

## 2023-06-15 DIAGNOSIS — D3702 Neoplasm of uncertain behavior of tongue: Secondary | ICD-10-CM

## 2023-06-15 MED ORDER — IOPAMIDOL (ISOVUE-300) INJECTION 61%
75.0000 mL | Freq: Once | INTRAVENOUS | Status: AC | PRN
  Administered 2023-06-15: 75 mL via INTRAVENOUS

## 2023-06-27 ENCOUNTER — Other Ambulatory Visit: Payer: Self-pay | Admitting: Otolaryngology

## 2023-06-27 DIAGNOSIS — C029 Malignant neoplasm of tongue, unspecified: Secondary | ICD-10-CM

## 2023-07-03 ENCOUNTER — Ambulatory Visit
Admission: RE | Admit: 2023-07-03 | Discharge: 2023-07-03 | Disposition: A | Discharge status: Home / Self Care | Source: Ambulatory Visit | Attending: Otolaryngology | Admitting: Otolaryngology

## 2023-07-03 DIAGNOSIS — C029 Malignant neoplasm of tongue, unspecified: Secondary | ICD-10-CM

## 2023-07-03 MED ORDER — IOHEXOL 300 MG/ML  SOLN
75.0000 mL | Freq: Once | INTRAMUSCULAR | Status: AC | PRN
  Administered 2023-07-03: 75 mL via INTRAVENOUS
